# Patient Record
Sex: Female | Born: 1965 | Race: White | Hispanic: No | Smoking: Current every day smoker
Health system: Southern US, Community
[De-identification: ages and names within clinical notes are randomized; demographics above are authoritative.]

## PROBLEM LIST (undated history)

## (undated) DIAGNOSIS — F419 Anxiety disorder, unspecified: Secondary | ICD-10-CM

## (undated) DIAGNOSIS — I10 Essential (primary) hypertension: Secondary | ICD-10-CM

## (undated) DIAGNOSIS — F32A Depression, unspecified: Secondary | ICD-10-CM

## (undated) DIAGNOSIS — G8929 Other chronic pain: Secondary | ICD-10-CM

## (undated) DIAGNOSIS — F329 Major depressive disorder, single episode, unspecified: Secondary | ICD-10-CM

## (undated) DIAGNOSIS — M545 Low back pain, unspecified: Secondary | ICD-10-CM

## (undated) DIAGNOSIS — J189 Pneumonia, unspecified organism: Secondary | ICD-10-CM

## (undated) DIAGNOSIS — K219 Gastro-esophageal reflux disease without esophagitis: Secondary | ICD-10-CM

## (undated) DIAGNOSIS — G43909 Migraine, unspecified, not intractable, without status migrainosus: Secondary | ICD-10-CM

## (undated) DIAGNOSIS — IMO0002 Reserved for concepts with insufficient information to code with codable children: Secondary | ICD-10-CM

## (undated) DIAGNOSIS — E7800 Pure hypercholesterolemia, unspecified: Secondary | ICD-10-CM

## (undated) DIAGNOSIS — Z8719 Personal history of other diseases of the digestive system: Secondary | ICD-10-CM

## (undated) DIAGNOSIS — J439 Emphysema, unspecified: Secondary | ICD-10-CM

## (undated) DIAGNOSIS — E079 Disorder of thyroid, unspecified: Secondary | ICD-10-CM

## (undated) DIAGNOSIS — E119 Type 2 diabetes mellitus without complications: Secondary | ICD-10-CM

## (undated) DIAGNOSIS — C569 Malignant neoplasm of unspecified ovary: Secondary | ICD-10-CM

## (undated) DIAGNOSIS — J449 Chronic obstructive pulmonary disease, unspecified: Secondary | ICD-10-CM

## (undated) HISTORY — PX: BACK SURGERY: SHX140

## (undated) HISTORY — PX: DILATION AND CURETTAGE OF UTERUS: SHX78

## (undated) HISTORY — PX: ABDOMINAL HYSTERECTOMY: SHX81

---

## 2000-07-05 ENCOUNTER — Emergency Department (HOSPITAL_COMMUNITY): Admission: EM | Admit: 2000-07-05 | Discharge: 2000-07-05 | Payer: Self-pay | Admitting: Emergency Medicine

## 2000-08-16 ENCOUNTER — Encounter: Payer: Self-pay | Admitting: Emergency Medicine

## 2000-08-16 ENCOUNTER — Emergency Department (HOSPITAL_COMMUNITY): Admission: EM | Admit: 2000-08-16 | Discharge: 2000-08-16 | Payer: Self-pay | Admitting: Emergency Medicine

## 2000-09-06 ENCOUNTER — Emergency Department (HOSPITAL_COMMUNITY): Admission: EM | Admit: 2000-09-06 | Discharge: 2000-09-06 | Payer: Self-pay | Admitting: Emergency Medicine

## 2001-03-10 ENCOUNTER — Emergency Department (HOSPITAL_COMMUNITY): Admission: EM | Admit: 2001-03-10 | Discharge: 2001-03-11 | Payer: Self-pay | Admitting: Emergency Medicine

## 2001-04-09 ENCOUNTER — Inpatient Hospital Stay (HOSPITAL_COMMUNITY): Admission: RE | Admit: 2001-04-09 | Discharge: 2001-04-12 | Payer: Self-pay | Admitting: *Deleted

## 2001-06-18 ENCOUNTER — Emergency Department (HOSPITAL_COMMUNITY): Admission: EM | Admit: 2001-06-18 | Discharge: 2001-06-18 | Payer: Self-pay | Admitting: Emergency Medicine

## 2001-10-18 ENCOUNTER — Encounter: Payer: Self-pay | Admitting: Family Medicine

## 2001-10-18 ENCOUNTER — Ambulatory Visit (HOSPITAL_COMMUNITY): Admission: RE | Admit: 2001-10-18 | Discharge: 2001-10-18 | Payer: Self-pay | Admitting: Family Medicine

## 2001-12-07 ENCOUNTER — Encounter: Payer: Self-pay | Admitting: Orthopaedic Surgery

## 2001-12-07 ENCOUNTER — Ambulatory Visit (HOSPITAL_COMMUNITY): Admission: RE | Admit: 2001-12-07 | Discharge: 2001-12-07 | Payer: Self-pay | Admitting: Orthopaedic Surgery

## 2001-12-14 ENCOUNTER — Ambulatory Visit (HOSPITAL_COMMUNITY): Admission: RE | Admit: 2001-12-14 | Discharge: 2001-12-14 | Payer: Self-pay | Admitting: Pulmonary Disease

## 2002-01-03 ENCOUNTER — Encounter: Payer: Self-pay | Admitting: Family Medicine

## 2002-01-03 ENCOUNTER — Ambulatory Visit (HOSPITAL_COMMUNITY): Admission: RE | Admit: 2002-01-03 | Discharge: 2002-01-03 | Payer: Self-pay | Admitting: Family Medicine

## 2002-07-08 ENCOUNTER — Emergency Department (HOSPITAL_COMMUNITY): Admission: EM | Admit: 2002-07-08 | Discharge: 2002-07-08 | Payer: Self-pay

## 2002-08-23 ENCOUNTER — Encounter: Payer: Self-pay | Admitting: Emergency Medicine

## 2002-08-23 ENCOUNTER — Emergency Department (HOSPITAL_COMMUNITY): Admission: EM | Admit: 2002-08-23 | Discharge: 2002-08-23 | Payer: Self-pay | Admitting: Emergency Medicine

## 2002-08-27 ENCOUNTER — Emergency Department (HOSPITAL_COMMUNITY): Admission: EM | Admit: 2002-08-27 | Discharge: 2002-08-27 | Payer: Self-pay | Admitting: Emergency Medicine

## 2002-08-27 ENCOUNTER — Encounter: Payer: Self-pay | Admitting: Emergency Medicine

## 2002-10-21 ENCOUNTER — Emergency Department (HOSPITAL_COMMUNITY): Admission: EM | Admit: 2002-10-21 | Discharge: 2002-10-22 | Payer: Self-pay | Admitting: Emergency Medicine

## 2002-10-22 ENCOUNTER — Encounter: Payer: Self-pay | Admitting: Emergency Medicine

## 2002-10-31 ENCOUNTER — Encounter: Payer: Self-pay | Admitting: General Surgery

## 2002-10-31 ENCOUNTER — Ambulatory Visit (HOSPITAL_COMMUNITY): Admission: RE | Admit: 2002-10-31 | Discharge: 2002-10-31 | Payer: Self-pay | Admitting: General Surgery

## 2004-01-03 ENCOUNTER — Ambulatory Visit (HOSPITAL_COMMUNITY): Admission: RE | Admit: 2004-01-03 | Discharge: 2004-01-03 | Payer: Self-pay | Admitting: Family Medicine

## 2004-03-20 ENCOUNTER — Emergency Department (HOSPITAL_COMMUNITY): Admission: EM | Admit: 2004-03-20 | Discharge: 2004-03-20 | Payer: Self-pay | Admitting: Emergency Medicine

## 2004-06-05 ENCOUNTER — Ambulatory Visit (HOSPITAL_COMMUNITY): Admission: RE | Admit: 2004-06-05 | Discharge: 2004-06-05 | Payer: Self-pay | Admitting: Family Medicine

## 2004-06-30 ENCOUNTER — Emergency Department (HOSPITAL_COMMUNITY): Admission: EM | Admit: 2004-06-30 | Discharge: 2004-06-30 | Payer: Self-pay | Admitting: Emergency Medicine

## 2005-04-27 ENCOUNTER — Emergency Department (HOSPITAL_COMMUNITY): Admission: EM | Admit: 2005-04-27 | Discharge: 2005-04-27 | Payer: Self-pay | Admitting: *Deleted

## 2009-10-01 ENCOUNTER — Emergency Department (HOSPITAL_COMMUNITY): Admission: EM | Admit: 2009-10-01 | Discharge: 2009-10-01 | Payer: Self-pay | Admitting: Emergency Medicine

## 2010-01-26 ENCOUNTER — Encounter: Payer: Self-pay | Admitting: Family Medicine

## 2010-01-27 ENCOUNTER — Encounter: Payer: Self-pay | Admitting: Family Medicine

## 2010-01-27 ENCOUNTER — Encounter: Payer: Self-pay | Admitting: *Deleted

## 2010-01-27 ENCOUNTER — Encounter: Payer: Self-pay | Admitting: Neurology

## 2010-04-27 ENCOUNTER — Emergency Department (HOSPITAL_COMMUNITY)
Admission: EM | Admit: 2010-04-27 | Discharge: 2010-04-27 | Disposition: A | Discharge status: Home / Self Care | Payer: Self-pay | Attending: Emergency Medicine | Admitting: Emergency Medicine

## 2010-04-27 ENCOUNTER — Emergency Department (HOSPITAL_COMMUNITY): Payer: Self-pay

## 2010-04-27 DIAGNOSIS — I10 Essential (primary) hypertension: Secondary | ICD-10-CM | POA: Insufficient documentation

## 2010-04-27 DIAGNOSIS — E785 Hyperlipidemia, unspecified: Secondary | ICD-10-CM | POA: Insufficient documentation

## 2010-04-27 DIAGNOSIS — W010XXA Fall on same level from slipping, tripping and stumbling without subsequent striking against object, initial encounter: Secondary | ICD-10-CM | POA: Insufficient documentation

## 2010-04-27 DIAGNOSIS — E119 Type 2 diabetes mellitus without complications: Secondary | ICD-10-CM | POA: Insufficient documentation

## 2010-04-27 DIAGNOSIS — Y9229 Other specified public building as the place of occurrence of the external cause: Secondary | ICD-10-CM | POA: Insufficient documentation

## 2010-04-27 DIAGNOSIS — E039 Hypothyroidism, unspecified: Secondary | ICD-10-CM | POA: Insufficient documentation

## 2010-04-27 DIAGNOSIS — S63509A Unspecified sprain of unspecified wrist, initial encounter: Secondary | ICD-10-CM | POA: Insufficient documentation

## 2010-05-02 ENCOUNTER — Emergency Department (HOSPITAL_COMMUNITY)
Admission: EM | Admit: 2010-05-02 | Discharge: 2010-05-02 | Disposition: A | Discharge status: Home / Self Care | Payer: Self-pay | Attending: Emergency Medicine | Admitting: Emergency Medicine

## 2010-05-02 DIAGNOSIS — M25549 Pain in joints of unspecified hand: Secondary | ICD-10-CM | POA: Insufficient documentation

## 2010-05-02 DIAGNOSIS — X58XXXS Exposure to other specified factors, sequela: Secondary | ICD-10-CM | POA: Insufficient documentation

## 2010-05-02 DIAGNOSIS — IMO0002 Reserved for concepts with insufficient information to code with codable children: Secondary | ICD-10-CM | POA: Insufficient documentation

## 2010-05-24 NOTE — H&P (Signed)
[facility]  Patient:    [name], [name] Visit Number: [id number] MRN: [id number]          Service Type: EMS Location: ED Attending Physician:  Annamarie Dawley Dictated by:   Langley Gauss, M.D. Admit Date:  03/10/2001 Discharge Date: 03/11/2001                           History and Physical  CHIEF COMPLAINT:  This is a 45 year old gravida 5 para 2, with two prior cesarean sections, who is admitted for total abdominal hysterectomy and bilateral salpingo-oophorectomy.  The patients chief complaint is that of menometrorrhagia with flow for two weeks out of the month with clots and severe cramping.  HISTORY OF PRESENT ILLNESS:  She does take Tylenol or Motrin 800 mg tablets with only minimal relief of the dysmenorrhea.  The patient states that the pain increases when she sits down and it feels like she is having a baby.  The bleeding episodes do occur on a q.monthly basis and are associated with passage of clots.  The patient gives the history that she felt as though she had tumors on her ovaries, stating that one to two years ago an ultrasound was ordered by Dr. Emelda Fear which showed at least two cysts; however, in discussion it becomes possible that what she is describing are fibroid tumors. We did attempt to obtain the ultrasound from [facility] one to two years previously and they have no record of an ultrasound having been done.  The patient also states she experiences bilateral ovarian pain constant throughout the month and increasing during her menses.  PAST MEDICAL HISTORY:  1. Low transverse cesarean section x2, in 1988 and 1995.  2. Patient underwent bilateral tubal ligation in 1995.  3. Patient also has recurrent asthmatic bronchitis, for which she takes     Combivent inhaler.     a. Of note, the patient did have an ER visit about one month ago when she        had run out of her Combivent inhaler.  She went to the  emergency        room for treatment of asthma.  Combivent inhaler was restarted at        that time and the patient has continued to do well.  REVIEW OF SYSTEMS:  Pertinent for no history of hypertension, no history of diabetes.  Patient denies any history of stress urinary incontinence or urge incontinence.  She does have occasional hot flashes.  SOCIAL HISTORY:  The patient smokes about a pack a day.  Most recent employment was at [employer].  She is married.  ALLERGIES:  She states she is allergic to SULFA.  CURRENT MEDICATIONS:  Combivent inhaler.  PHYSICAL EXAMINATION:  GENERAL:  Greater than stated age appearing white female.  VITAL SIGNS:  Height 4 feet 7 inches.  Weight 132 pounds.  Blood pressure 136/91, pulse 97, respiratory rate 20.  HEENT:  Negative.  No adenopathy.  Dentition intact.  NECK:  Supple.  Thyroid not palpable.  LUNGS:  Clear.  CARDIOVASCULAR:  Regular rate and rhythm.  ABDOMEN:  Soft, nontender.  Pfannenstiel incision noted from prior low transverse cesarean sections.  EXTREMITIES:  Normal.  PELVIC:  Normal external genitalia.  Cervix without lesions.  Bladder is well supported as well as urethra.  Likewise, there is no significant rectocele identified.  Bimanual examination reveals a diffusely enlarged uterus, particularly in the right  fundal portion.  Adnexa themselves are nonpalpable, but no significant adnexal masses are appreciated.  LABORATORY DATA:  A transvaginal ultrasound was performed by Dr. Roylene Reason. Lisette Grinder, which reveals normal appearing left and right ovaries, though somewhat atrophic in appearance, with the left measuring maximum diameter of 1.04 cm, right maximum diameter of 1.29 cm.  There is noted to be at least two anterior fundal fibroids identified, 2.25 cm and 1.52 cm.  These are very well circumscribed and noted to be calcified.  ASSESSMENT:  Patient with menometrorrhagia and severe dysmenorrhea, bleeding for two  weeks out of the month.  She is not a candidate for oral contraceptives secondary to smoking history.  PLAN:  The patient is admitted at this time for TSH/BSO.  She understands that as a result of surgical menopause she would be well served to start estrogen replacement therapy immediately postoperatively.  The patient expresses understanding of risks and benefits and the procedure was discussed with the patient.  The patient has reviewed the Climara package, understanding hysterectomy.  All questions answered. Dictated by:   Langley Gauss, M.D. Attending Physician:  Annamarie Dawley DD:  04/08/01 TD:  04/09/01 Job: [id number]

## 2010-05-24 NOTE — Procedures (Signed)
   NAME:  LANEE, CHAIN                     ACCOUNT NO.:  [id number]   MEDICAL RECORD NO.:  [id number]                   PATIENT TYPE:  OUT   LOCATION:  RESP                                 FACILITY:  [facility]   PHYSICIAN:  Edward L. Juanetta Gosling, M.D.             DATE OF BIRTH:  May 10, 1965   DATE OF PROCEDURE:  12/14/2001  DATE OF DISCHARGE:                              PULMONARY FUNCTION TEST   1. Spirometry shows severe ventilatory defect.  She could not do lung     volumes or DLCO.  2. There is significant increase in the FVC after inhaled bronchodilator.  3. Arterial blood gas shows normal arterial blood gas.                                                Edward L. Juanetta Gosling, M.D.    ELH/MEDQ  D:  12/14/2001  T:  12/15/2001  Job:  [id number]   cc:   [facility]

## 2010-05-24 NOTE — Discharge Summary (Signed)
[facility]  Patient:    [name], [name] Visit Number: [id number] MRN: [id number]          Service Type: MED Location: 3A A306 01 Attending Physician:  Jeri Cos. Dictated by:   Langley Gauss, M.D. Admit Date:  04/09/2001 Disc. Date: 04/12/01                             Discharge Summary  PROCEDURES:  Total abdominal hysterectomy, bilateral salpingo-oophorectomy.  FOLLOW-UP:  The patient is to follow up in the office in two days time for staple removal.  DISCHARGE MEDICATIONS: 1. Tylox. 2. The patient does have a Climera patch in place at the time of discharge,    but she will be changed to Estrace 1 mg p.o. q.d. for long-term management.  COMPLICATIONS:  Hospitalization is complicated by a postoperative ileus with resultant stay lengthened from April 6 to April 12, 2001.  DISCHARGE INSTRUCTIONS:  At the time of discharge the patient is given the standard discharge instructions.  LABORATORY DATA:  Blood type O positive.  The hCG is negative.  Electrolytes within normal limits.  Hemoglobin and hematocrit 13.5/37.6, with a white count of 9.5.  Postoperative day #1 11.3/32.0.  HOSPITAL COURSE:  The patient was admitted, taken to the operating room for TAH/BSO.  This was performed without complications.  Postoperatively the patient did well.  Foley catheter documented excellent urine output on the evening of surgery.  The Foley catheter was removed on postoperative day #1. The patient thereafter was up to ambulate to smoke only.  The JP drain was removed on postoperative day #2 when drainage had markedly subsided.  However, the patient still had not passed any flatus despite ambulation.  The patient continued to be afebrile, had excellent urine output.  She did advance her diet.  She did receive a single Dulcolax suppository on April 11, 2001.  On April 12, 2001, the patient is now ambulating much better.  She complains of no hot  flashes.  She is tolerating a regular general diet.  Tylox p.o. is adequate for pain relief, and she has had normal resumption of bowel function. Thus, the patient is discharged home on April 12, 2001.  Follow up in the office in two days time for staple removal from the Pfannenstiel incision. Dictated by:   Langley Gauss, M.D. Attending Physician:  Jeri Cos. DD:  04/12/01 TD:  04/12/01 Job: [id number]

## 2010-05-24 NOTE — Op Note (Signed)
[facility]  Patient:    [name], [name] Visit Number: [id number] MRN: [id number]          Service Type: MED Location: 3A A306 01 Attending Physician:  Jeri Cos. Dictated by:   Langley Gauss, M.D. Proc. Date: 04/09/01 Admit Date:  04/09/2001                             Operative Report  PREOPERATIVE DIAGNOSES: 1. Menometrorrhagia. 2. Dysmenorrhea.  PROCEDURE PERFORMED:  Total abdominal hysterectomy, bilateral salpingo-oophorectomy.  SURGEON:  Langley Gauss, M.D.  ESTIMATED BLOOD LOSS:  200 cc.  ANESTHESIA:  General anesthesia.  DRAINS:  Foley catheter to straight drainage, JP catheter within the subcutaneous space.  OPERATIVE REPORT:  The patient was taken to the operating room.  Vital signs were stable.  The patient underwent an uncomplicated induction of general endotracheal anesthesia after which time she was prepped and draped in the usual sterile fashion.  A Foley catheter was sterilely placed to straight drainage with findings of clear yellow urine.  A sharp knife was then used to incise a Pfannenstiel incision through the skin at her previous operative site and dissected down through the fascial plane utilizing a sharp knife, cauterizing all bleeders along the way.  The fascia was then incised in a transverse curvilinear manner, utilizing the Mayo scissors while sharply dissecting off the underlying rectus muscles.  The edges of the fascia were then grasped using straight Kocher clamps with the fascia dissecting off the underlying rectus muscles in the midline, utilizing the Mayo scissors.  This was done both superiorly and inferiorly at the incision site.  The rectus muscles were then bluntly separated.  The peritoneal cavity was atraumatically bluntly entered at the superior most portion of this incision.  The peritoneal incision next done superiorly and inferiorly where we directly visualized the bladder to avoid  its accidental entry.  Self-retraining retractor was placed as well as a bladder blade.  Due to the obesity and redundant perineal tissue with underlying fat, it was required to elevate and suture the bladder peritoneum to the anterior abdominal skin.  After placing the bladder blade and the self-retaining retractors, moist packs were then used to mobilize bladder out of the operative field.  Findings at the time of surgery included diffusely enlarged uterus, atrophic-appearing ovaries bilaterally, no significant adhesive disease encountered with the exception of some omentum adherent to the anterior abdominal wall, which was easily dissected free utilizing the Bovie on a cauterization setting.  Long straight Kocher clamps were then used to grasp the specimen at the junction of the round ligament and fallopian tubes at the uterus itself.  This allowed manipulation of the specimen throughout the operative procedure.  The right round ligament was identified, clamped with a Kelly clamp to avoid any back bleeding, suture ligature of #0 Vicryl in a Heaney fashion was then placed.  Mayo scissors were then used to sharply transect the round ligament.  This allowed skeletonization of the infundibulopelvic ligament on the right by dissecting the vascular plane of the broad ligament.  The ureters were noted to be in the normal anatomic position along the lateral pelvic sidewalls.  The right infundibulopelvic ligament was then doubly clamped with Kelly clamps and then doubly ligated first with a free tie of #0 Vicryl followed by suture ligature of #0 Vicryl in a Heaney fashion.  I then performed the identical procedure on the left by ligating the  left round ligament with #0 Vicryl in a Heaney fashion, followed by skeletonization of the left infundibulopelvic ligament, and then doubly ligated this small pedicle with #0 Vicryl free tie, followed by #0 Vicryl suture in a Heaney fashion.  These pedicles  being secured, I then was able to skeletonize the uterine vesicles bilaterally, and continuing across the anterior lower uterine segment in the avascular plane to create a bladder flap.  The right uterine vesicle was clamped first.  A Kelly clamp was placed to control any back bleeding, followed by curved Heaney clamp, followed by suture ligature of #0 Vicryl.  The left uterine vesicle was likewise clamped with a single curved Heaney clamp followed by suture ligature of #0 Vicryl.  This took me down to the level of the cardinal ligaments.  Each of these were clamped with a straight Heaney clamp, followed by suture ligature of #0 Vicryl in Heaney fashion.  The uterosacral ligaments bilaterally were then clamped sequentially with curved Heaney clamps followed by suture ligature of #0 Vicryl in the Heaney fashion.  The vaginal angles then identified.  The right vaginal angle was clamped and dissected at the vaginal angle which allowed entry into the upper vagina.  Suture ligature of #0 Vicryl in a Heaney fashion was then placed at the right vaginal angle.  This was tagged for later inspection.  Likewise the left vaginal angle was identified, clamped with a curved Heaney clamp, followed by suture ligature of #0 Vicryl in a Heaney fashion, and tagged for later inspection.  This then allowed me to transect across the vaginal mucosa leaving a maximum vaginal depth while removing in entirety the specimen which was then handed off for permanent specimen.  Irrigation was then performed to the vaginal cuff utilizing a Betadine solution, and the vaginal cuff, after grasping with straight Kocher clamps, was closed with figure-of-eight sutures of #0 Vicryl, three in total number required.  Irrigation was then performed at the pelvic cavity to assure hemostasis.  At the level of the right vaginal cuff a small amount of tissue had slipped through our clamp prior to suturing.  Thus, an  additional figure-of-eight suture was placed in the right vaginal angle to reinforce the suture here, and assure hemostasis.  Copious irrigation was then performed to  assure hemostasis.  Hemostasis being assured our instruments were removed as well as our moist packs.  Sponge and instrument counts were correct x 2 at this point.  Thus, procedure closure was initiated.  The peritoneal edges were grasped using Kelly clamps.  The peritoneum was closed with a continuous running #0 chromic suture.  Rectus muscles were closed with a continuous running #0 chromic suture reapproximating them in the midline.  A looped #1 Novofil suture was then used to completely close the fascia.  A JP drain was then placed in the subcutaneous space with a separate exit wound to the right apex of the incision.  Subcutaneous bleeders were cauterized.  Three interrupted #1 Maxon sutures were placed through-and-through the skin edges to facilitate complete closure.  The skin was then completely closed utilizing skin staples.  The patient tolerated the procedure well.  In an effort to facilitate postoperative analgesia, 20 cc of 0.5% bupivacaine was injected subcutaneously and along the entirety of the incision.  The patient continued to drain clear yellow urine.  She was reversed of anesthesia, taken to the recovery room in stable condition, at which time operative findings were discussed with the patients awaiting family. Dictated by:  Langley Gauss, M.D. Attending Physician:  Jeri Cos. DD:  04/12/01 TD:  04/13/01 Job: [id number]

## 2010-07-16 ENCOUNTER — Emergency Department (HOSPITAL_COMMUNITY)
Admission: EM | Admit: 2010-07-16 | Discharge: 2010-07-16 | Disposition: A | Discharge status: Home / Self Care | Payer: Self-pay | Attending: Emergency Medicine | Admitting: Emergency Medicine

## 2010-07-16 DIAGNOSIS — E119 Type 2 diabetes mellitus without complications: Secondary | ICD-10-CM | POA: Insufficient documentation

## 2010-07-16 DIAGNOSIS — J4489 Other specified chronic obstructive pulmonary disease: Secondary | ICD-10-CM | POA: Insufficient documentation

## 2010-07-16 DIAGNOSIS — I10 Essential (primary) hypertension: Secondary | ICD-10-CM | POA: Insufficient documentation

## 2010-07-16 DIAGNOSIS — S335XXA Sprain of ligaments of lumbar spine, initial encounter: Secondary | ICD-10-CM | POA: Insufficient documentation

## 2010-07-16 DIAGNOSIS — F17200 Nicotine dependence, unspecified, uncomplicated: Secondary | ICD-10-CM | POA: Insufficient documentation

## 2010-07-16 DIAGNOSIS — J449 Chronic obstructive pulmonary disease, unspecified: Secondary | ICD-10-CM | POA: Insufficient documentation

## 2010-07-16 HISTORY — DX: Chronic obstructive pulmonary disease, unspecified: J44.9

## 2010-07-16 HISTORY — DX: Reserved for concepts with insufficient information to code with codable children: IMO0002

## 2010-07-16 HISTORY — DX: Depression, unspecified: F32.A

## 2010-07-16 HISTORY — DX: Essential (primary) hypertension: I10

## 2010-07-16 HISTORY — DX: Major depressive disorder, single episode, unspecified: F32.9

## 2010-07-16 HISTORY — DX: Disorder of thyroid, unspecified: E07.9

## 2010-07-16 MED ORDER — METHOCARBAMOL 500 MG PO TABS: 500.0000 mg | ORAL_TABLET | Freq: Three times a day (TID) | ORAL | Status: AC

## 2010-07-16 MED ORDER — HYDROCODONE-ACETAMINOPHEN 5-325 MG PO TABS: ORAL_TABLET | ORAL | Status: AC

## 2010-07-16 MED ORDER — PREDNISONE 10 MG PO TABS: ORAL_TABLET | ORAL | Status: DC
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
## 2010-07-16 NOTE — ED Notes (Signed)
Pt states that she is allergic to prednisone, [place], [place] notified, pt also states that she does not have any pain medication at home, [place], [place] notified, additional prescriptions,
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
## 2010-08-28 ENCOUNTER — Emergency Department (HOSPITAL_COMMUNITY)
Admission: EM | Admit: 2010-08-28 | Discharge: 2010-08-28 | Disposition: A | Discharge status: Home / Self Care | Payer: Self-pay | Attending: Emergency Medicine | Admitting: Emergency Medicine

## 2010-08-28 ENCOUNTER — Encounter (HOSPITAL_COMMUNITY): Payer: Self-pay | Admitting: *Deleted

## 2010-08-28 ENCOUNTER — Emergency Department (HOSPITAL_COMMUNITY): Payer: Self-pay

## 2010-08-28 DIAGNOSIS — M542 Cervicalgia: Secondary | ICD-10-CM | POA: Insufficient documentation

## 2010-08-28 DIAGNOSIS — M549 Dorsalgia, unspecified: Secondary | ICD-10-CM | POA: Insufficient documentation

## 2010-08-28 DIAGNOSIS — W010XXA Fall on same level from slipping, tripping and stumbling without subsequent striking against object, initial encounter: Secondary | ICD-10-CM | POA: Insufficient documentation

## 2010-08-28 DIAGNOSIS — M25559 Pain in unspecified hip: Secondary | ICD-10-CM | POA: Insufficient documentation

## 2010-08-28 DIAGNOSIS — M25529 Pain in unspecified elbow: Secondary | ICD-10-CM | POA: Insufficient documentation

## 2010-08-28 DIAGNOSIS — Y92009 Unspecified place in unspecified non-institutional (private) residence as the place of occurrence of the external cause: Secondary | ICD-10-CM | POA: Insufficient documentation

## 2010-08-28 DIAGNOSIS — M25569 Pain in unspecified knee: Secondary | ICD-10-CM | POA: Insufficient documentation

## 2010-08-28 MED ORDER — HYDROCODONE-ACETAMINOPHEN 5-325 MG PO TABS: 1.0000 | ORAL_TABLET | Freq: Four times a day (QID) | ORAL | Status: AC | PRN

## 2010-08-28 NOTE — ED Provider Notes (Signed)
History   Chart scribed for Tanya Lennert, MD by Enos Fling; the patient was seen in room APA11/APA11; this patient's care was started at 12:04 PM.    CSN: [id number] Arrival date & time: 08/28/2010 11:20 AM  Chief Complaint  Patient presents with  . Fall   HPI TANIJA GERMANI is a 45 y.o. female who presents to the Emergency Department s/p fall. Pt reports fall 2 days ago by tripping over step walking into her house. No head injury or LOC. Pt c/o pain to neck, low back, right hip, left knee, and left elbow constant since fall. Pt has been using tylenol for pain with no relief. Pt states she has chronic back pain and this pain in her back is the same pain as usual but worse since fall. Used to have hydrocodone for chronic pain but ran out yesterday; states she took it every 4-6 hours and it would only ease the pain. No abd pain, chest pain, n/v/d,    Review of Systems  Constitutional: Negative for fatigue.  HENT: Negative for congestion, sinus pressure and ear discharge.   Eyes: Negative for discharge.  Respiratory: Negative for cough.   Cardiovascular: Negative for chest pain.  Gastrointestinal: Negative for abdominal pain and diarrhea.  Genitourinary: Negative for frequency and hematuria.  Musculoskeletal: Positive for back pain and arthralgias.  Skin: Negative for rash.  Neurological: Negative for seizures and headaches.  Hematological: Negative.   Psychiatric/Behavioral: Negative for hallucinations.    Physical Exam  BP 122/72  Pulse 74  Temp(Src) 98.4 F (36.9 C) (Oral)  Resp 14  Ht 4' (1.219 m)  Wt 145 lb (65.772 kg)  BMI 44.25 kg/m2  SpO2 97%  Physical Exam  Constitutional: She is oriented to person, place, and time. She appears well-developed.       Appears slightly drowsy  HENT:  Head: Normocephalic and atraumatic.  Eyes: Conjunctivae and EOM are normal. No scleral icterus.  Neck: Neck supple. No thyromegaly present.  Cardiovascular: Normal rate and  regular rhythm.  Exam reveals no gallop and no friction rub.   No murmur heard. Pulmonary/Chest: No stridor. She has no wheezes. She has no rales. She exhibits no tenderness.  Abdominal: She exhibits no distension. There is no tenderness. There is no rebound.  Musculoskeletal: Normal range of motion. She exhibits tenderness. She exhibits no edema.       Diffuse tenderness to cervical spine, lumbar spine, left hip, and right hip - no point tenderness, swelling, or bruising; tenderness and bruising to left elbow and left knee; FROM to all extremities  Lymphadenopathy:    She has no cervical adenopathy.  Neurological: She is oriented to person, place, and time. Coordination normal.  Skin: No rash noted. No erythema.  Psychiatric: She has a normal mood and affect. Her behavior is normal.    ED Course  Procedures  OTHER DATA REVIEWED: Nursing notes and vital signs reviewed. Prior records reviewed.   LABS / RADIOLOGY: Dg Lumbar Spine Complete  08/28/2010  *RADIOLOGY REPORT*  Clinical Data: Low back pain post fall  LUMBAR SPINE - COMPLETE 4+ VIEW  Comparison: 10/01/2009  Findings: Five non-rib bearing lumbar vertebrae. Question mild osseous demineralization. Vertebral body and disc space heights maintained. No acute fracture, subluxation or bone destruction. No spondylolysis. SI joints symmetric. Scattered phleboliths.  IMPRESSION: No acute lumbar spine abnormalities.  Original Report Authenticated By: Lollie Marrow, M.D.   Dg Pelvis 1-2 Views  08/28/2010  *RADIOLOGY REPORT*  Clinical Data: Pelvic pain  post fall  PELVIS - 1-2 VIEW  Comparison: None  Findings: Osseous demineralization. Hip and SI joints preserved. No acute fracture, dislocation, or bone destruction. Symmetric sacral neural foramina.  IMPRESSION: No acute abnormalities.  Original Report Authenticated By: Lollie Marrow, M.D.   Dg Elbow Complete Left  08/28/2010  *RADIOLOGY REPORT*  Clinical Data:  Medial left elbow pain post fall   LEFT ELBOW - COMPLETE 3+ VIEW  Comparison: None  Findings: Bone mineralization normal. Joint spaces preserved. No fracture, dislocation, or bone destruction. No joint effusion.  IMPRESSION: Normal exam.  Original Report Authenticated By: Lollie Marrow, M.D.   Dg Knee Complete 4 Views Left  08/28/2010  *RADIOLOGY REPORT*  Clinical Data: Anterior left knee pain post fall  LEFT KNEE - COMPLETE 4+ VIEW  Comparison: 04/27/2005  Findings: Osseous demineralization questioned. Minimal medial compartment joint space narrowing. No acute fracture, dislocation or bone destruction. No knee joint effusion.  IMPRESSION: Minimal degenerative changes right knee. No acute abnormalities.  Original Report Authenticated By: Lollie Marrow, M.D.    ED COURSE: 2:59 PM - Pt sleeping, no pain currently. All results reviewed and discussed with pt, questions answered, pt agreeable with plan.  MDM:   IMPRESSION: No diagnosis found.   PLAN: discharge All results reviewed and discussed with pt, questions answered, pt agreeable with plan.   CONDITION ON DISCHARGE: stable   MEDS GIVEN IN ED: Medications - No data to display   DISCHARGE MEDICATIONS: New Prescriptions   No medications on file     SCRIBE ATTESTATION: The chart was scribed for me under my direct supervision.  I personally performed the history, physical, and medical decision making and all procedures in the evaluation of this patient.Tanya Lennert, MD 08/28/10 1500

## 2010-08-28 NOTE — ED Notes (Signed)
C/o multiple falls over last few weeks.  Last fall x 2 days ago.  C/o left knee pain, right hip pain, and lower back pain.  Denies LOC during any of recent falls.

## 2010-09-17 ENCOUNTER — Emergency Department (HOSPITAL_COMMUNITY): Payer: Self-pay

## 2010-09-17 ENCOUNTER — Emergency Department (HOSPITAL_COMMUNITY)
Admission: EM | Admit: 2010-09-17 | Discharge: 2010-09-17 | Disposition: A | Discharge status: Home / Self Care | Payer: Self-pay | Attending: Emergency Medicine | Admitting: Emergency Medicine

## 2010-09-17 ENCOUNTER — Encounter (HOSPITAL_COMMUNITY): Payer: Self-pay | Admitting: Emergency Medicine

## 2010-09-17 DIAGNOSIS — J4489 Other specified chronic obstructive pulmonary disease: Secondary | ICD-10-CM | POA: Insufficient documentation

## 2010-09-17 DIAGNOSIS — M545 Low back pain, unspecified: Secondary | ICD-10-CM | POA: Insufficient documentation

## 2010-09-17 DIAGNOSIS — I10 Essential (primary) hypertension: Secondary | ICD-10-CM | POA: Insufficient documentation

## 2010-09-17 DIAGNOSIS — S335XXA Sprain of ligaments of lumbar spine, initial encounter: Secondary | ICD-10-CM

## 2010-09-17 DIAGNOSIS — R109 Unspecified abdominal pain: Secondary | ICD-10-CM | POA: Insufficient documentation

## 2010-09-17 DIAGNOSIS — R300 Dysuria: Secondary | ICD-10-CM | POA: Insufficient documentation

## 2010-09-17 DIAGNOSIS — J449 Chronic obstructive pulmonary disease, unspecified: Secondary | ICD-10-CM | POA: Insufficient documentation

## 2010-09-17 DIAGNOSIS — N390 Urinary tract infection, site not specified: Secondary | ICD-10-CM | POA: Insufficient documentation

## 2010-09-17 LAB — URINALYSIS, ROUTINE W REFLEX MICROSCOPIC
Bilirubin Urine: NEGATIVE
Glucose, UA: NEGATIVE mg/dL
Hgb urine dipstick: NEGATIVE
Ketones, ur: NEGATIVE mg/dL
Nitrite: NEGATIVE
Protein, ur: NEGATIVE mg/dL
Specific Gravity, Urine: 1.02 (ref 1.005–1.030)
pH: 5.5 (ref 5.0–8.0)

## 2010-09-17 LAB — URINE MICROSCOPIC-ADD ON

## 2010-09-17 MED ORDER — HYDROCODONE-ACETAMINOPHEN 5-325 MG PO TABS
2.0000 | ORAL_TABLET | Freq: Once | ORAL | Status: AC
  Administered 2010-09-17: 2 via ORAL
  Filled 2010-09-17: qty 2

## 2010-09-17 MED ORDER — NITROFURANTOIN MONOHYD MACRO 100 MG PO CAPS: 100.0000 mg | ORAL_CAPSULE | Freq: Two times a day (BID) | ORAL | Status: AC

## 2010-09-17 MED ORDER — HYDROCODONE-ACETAMINOPHEN 5-325 MG PO TABS: 1.0000 | ORAL_TABLET | ORAL | Status: AC | PRN

## 2010-09-17 NOTE — ED Notes (Signed)
Pt c/o dysuria/hematuria/lower back pain.

## 2010-09-17 NOTE — ED Provider Notes (Signed)
History     CSN: [id number] Arrival date & time: 09/17/2010  2:25 PM  Chief Complaint  Patient presents with  . Dysuria  . Back Pain   HPI [name] is a 45 y.o. female who presents to the ED with UTI symptoms that started yesterday. She also complains of low back pain after a fall a few days ago. She has a history of chronic back pain and DDD, but pain has been worse since the fall.  Past Medical History  Diagnosis Date  . DDD (degenerative disc disease)   . COPD (chronic obstructive pulmonary disease)   . Diabetes mellitus   . Hypertension   . Thyroid disease   . Depression     Past Surgical History  Procedure Date  . Abdominal hysterectomy   . Cesarean section     History reviewed. No pertinent family history.  History  Substance Use Topics  . Smoking status: Passive Smoker  . Smokeless tobacco: Not on file  . Alcohol Use: No    OB History    Grav Para Term Preterm Abortions TAB SAB Ect Mult Living                  Review of Systems  Constitutional: Positive for unexpected weight change. Negative for fever and activity change.  HENT: Negative.   Eyes: Negative.   Respiratory: Negative for cough and wheezing.   Cardiovascular: Negative for chest pain, palpitations and leg swelling.  Gastrointestinal: Positive for nausea and abdominal pain. Negative for vomiting.  Genitourinary: Positive for dysuria, urgency and frequency.  Musculoskeletal: Positive for back pain.  Skin: Negative for rash and wound.  Neurological: Negative for dizziness, light-headedness and headaches.    Physical Exam  BP 156/93  Pulse 72  Temp(Src) 99.5 F (37.5 C) (Oral)  Resp 18  SpO2 99%  Physical Exam  Nursing note and vitals reviewed. Constitutional: She is oriented to person, place, and time. She appears well-developed and well-nourished. No distress.  HENT:  Head: Normocephalic and atraumatic.  Eyes: EOM are normal.  Neck: Normal range of motion. Neck supple.    Cardiovascular: Normal rate and regular rhythm.   Pulmonary/Chest: Effort normal.  Abdominal: Soft. There is tenderness in the suprapubic area.  Musculoskeletal: Normal range of motion.  Neurological: She is alert and oriented to person, place, and time. No cranial nerve deficit.  Skin: Skin is warm and dry.    ED Course  Procedures Patient awaiting x-ray. Care turned over to Burgess Amor, [facility]       [place], [place] 09/17/10 1710

## 2010-09-17 NOTE — ED Notes (Signed)
Pt a/ox4. Resp even and unlabored. NAD at this time. D/C instructions reviewed with pt. Pt verbalized understanding. Pt ambulated to lobby to wait for ride. Pt ambulated with steady gate.

## 2010-09-17 NOTE — ED Notes (Signed)
Patients LS spine is negative for any acute injury.  Will tx pain and also prescribe abx for uti.  Candis Musa, PA 09/17/10 1735  Donnetta Hutching, MD 01/24/14 2201

## 2010-09-17 NOTE — ED Provider Notes (Signed)
Medical screening examination/treatment/procedure(s) were performed by non-physician practitioner and as supervising physician I was immediately available for consultation/collaboration.  Charline Hoskinson, MD 09/17/10 1906 

## 2010-10-14 ENCOUNTER — Encounter (HOSPITAL_COMMUNITY): Payer: Self-pay | Admitting: Emergency Medicine

## 2010-10-14 ENCOUNTER — Emergency Department (HOSPITAL_COMMUNITY): Payer: Self-pay

## 2010-10-14 ENCOUNTER — Emergency Department (HOSPITAL_COMMUNITY)
Admission: EM | Admit: 2010-10-14 | Discharge: 2010-10-14 | Disposition: A | Discharge status: Home / Self Care | Payer: Self-pay | Attending: Emergency Medicine | Admitting: Emergency Medicine

## 2010-10-14 DIAGNOSIS — S0083XA Contusion of other part of head, initial encounter: Secondary | ICD-10-CM

## 2010-10-14 DIAGNOSIS — M1990 Unspecified osteoarthritis, unspecified site: Secondary | ICD-10-CM | POA: Insufficient documentation

## 2010-10-14 DIAGNOSIS — F17200 Nicotine dependence, unspecified, uncomplicated: Secondary | ICD-10-CM | POA: Insufficient documentation

## 2010-10-14 DIAGNOSIS — Z79899 Other long term (current) drug therapy: Secondary | ICD-10-CM | POA: Insufficient documentation

## 2010-10-14 DIAGNOSIS — M549 Dorsalgia, unspecified: Secondary | ICD-10-CM

## 2010-10-14 DIAGNOSIS — W19XXXA Unspecified fall, initial encounter: Secondary | ICD-10-CM | POA: Insufficient documentation

## 2010-10-14 DIAGNOSIS — S0003XA Contusion of scalp, initial encounter: Secondary | ICD-10-CM | POA: Insufficient documentation

## 2010-10-14 DIAGNOSIS — M545 Low back pain, unspecified: Secondary | ICD-10-CM | POA: Insufficient documentation

## 2010-10-14 MED ORDER — DIPHENHYDRAMINE HCL 25 MG PO CAPS
25.0000 mg | ORAL_CAPSULE | Freq: Once | ORAL | Status: AC
  Administered 2010-10-14: 25 mg via ORAL
  Filled 2010-10-14: qty 1

## 2010-10-14 MED ORDER — CARISOPRODOL 350 MG PO TABS: 350.0000 mg | ORAL_TABLET | Freq: Three times a day (TID) | ORAL | Status: AC

## 2010-10-14 MED ORDER — DIAZEPAM 5 MG PO TABS
5.0000 mg | ORAL_TABLET | Freq: Once | ORAL | Status: AC
  Administered 2010-10-14: 5 mg via ORAL
  Filled 2010-10-14: qty 1

## 2010-10-14 MED ORDER — HYDROCODONE-ACETAMINOPHEN 5-325 MG PO TABS
2.0000 | ORAL_TABLET | Freq: Once | ORAL | Status: AC
  Administered 2010-10-14: 2 via ORAL
  Filled 2010-10-14: qty 2

## 2010-10-14 MED ORDER — HYDROCODONE-ACETAMINOPHEN 5-325 MG PO TABS: 1.0000 | ORAL_TABLET | ORAL | Status: AC | PRN

## 2010-10-14 NOTE — ED Provider Notes (Signed)
History     CSN: [id number] Arrival date & time: 10/14/2010  8:38 PM  Chief Complaint  Patient presents with  . Fall  . Back Pain    (Consider location/radiation/quality/duration/timing/severity/associated sxs/prior treatment) HPI Comments: Pt states she has degenerative problems of the spine and had frequent falls. On yesterday she had more than one fall. She c/o bruises to the forehead, upper chest both arms and the left knee. She c/o being dizzy from time to time. She mostly c/o low back pain.  Patient is a 45 y.o. female presenting with fall and back pain. The history is provided by the patient.  Fall The accident occurred yesterday. The fall occurred while walking. She landed on carpet. There was no blood loss. The point of impact was the head (lower back). The pain is present in the head and left knee. The pain is severe. She was ambulatory at the scene. Pertinent negatives include no fever, no abdominal pain, no bowel incontinence, no nausea, no vomiting, no hematuria and no loss of consciousness. The symptoms are aggravated by standing. Treatments tried: Her own medications. The treatment provided no relief.  Back Pain  Pertinent negatives include no chest pain, no fever, no abdominal pain, no bowel incontinence and no dysuria.    Past Medical History  Diagnosis Date  . DDD (degenerative disc disease)   . COPD (chronic obstructive pulmonary disease)   . Diabetes mellitus   . Hypertension   . Thyroid disease   . Depression     Past Surgical History  Procedure Date  . Abdominal hysterectomy   . Cesarean section     No family history on file.  History  Substance Use Topics  . Smoking status: Current Everyday Smoker -- 0.5 packs/day    Types: Cigarettes  . Smokeless tobacco: Not on file  . Alcohol Use: No    OB History    Grav Para Term Preterm Abortions TAB SAB Ect Mult Living                  Review of Systems  Constitutional: Negative for fever and  activity change.       All ROS Neg except as noted in HPI  HENT: Negative for nosebleeds and neck pain.   Eyes: Negative for photophobia and discharge.  Respiratory: Negative for cough, shortness of breath and wheezing.   Cardiovascular: Negative for chest pain and palpitations.  Gastrointestinal: Negative for nausea, vomiting, abdominal pain, blood in stool and bowel incontinence.  Genitourinary: Negative for dysuria, frequency and hematuria.  Musculoskeletal: Positive for back pain. Negative for arthralgias.  Skin: Negative.        abrasions  Neurological: Negative for dizziness, seizures, loss of consciousness and speech difficulty.  Psychiatric/Behavioral: Negative for hallucinations and confusion.    Allergies  Flexeril; Keflex; Morphine and related; Ultram; Nsaids; Sulfa antibiotics; and Codeine  Home Medications   Current Outpatient Rx  Name Route Sig Dispense Refill  . ACETAMINOPHEN 500 MG PO TABS Oral Take 2,000 mg by mouth once as needed. For fever and for pain. Patient states that she sometimes takes 6 tablets in one dose for pain.     . ATENOLOL 100 MG PO TABS Oral Take 100 mg by mouth daily.      . CHOLECALCIFEROL 2000 UNITS PO CAPS Oral Take 1 capsule by mouth daily.      Marland Kitchen CLONAZEPAM 1 MG PO TABS Oral Take 1 mg by mouth 3 (three) times daily as needed. For anxiety and panic  attacks     . COD LIVER OIL PO CAPS Oral Take 1 capsule by mouth daily.      Marland Kitchen ESCITALOPRAM OXALATE 20 MG PO TABS Oral Take 20 mg by mouth every morning.     Marland Kitchen OMEGA-3 FATTY ACIDS 1000 MG PO CAPS Oral Take 1 g by mouth daily.     . IPRATROPIUM BROMIDE HFA 17 MCG/ACT IN AERS Inhalation Inhale 2 puffs into the lungs every 6 (six) hours.      . IPRATROPIUM BROMIDE 0.02 % IN SOLN Nebulization Take 500 mcg by nebulization 4 (four) times daily.      Marland Kitchen LEVOTHYROXINE SODIUM 25 MCG PO TABS Oral Take 25 mcg by mouth daily.      Marland Kitchen METFORMIN HCL 1000 MG PO TABS Oral Take 1,000 mg by mouth 2 (two) times daily  with a meal.      . PRAVASTATIN SODIUM 40 MG PO TABS Oral Take 40 mg by mouth at bedtime.     Marland Kitchen VITAMIN C 500 MG PO TABS Oral Take 1,500 mg by mouth daily.     Marland Kitchen CARISOPRODOL 350 MG PO TABS Oral Take 1 tablet (350 mg total) by mouth 3 (three) times daily. 15 tablet 0  . HYDROCODONE-ACETAMINOPHEN 7.5-500 MG PO TABS Oral Take 1 tablet by mouth every 6 (six) hours as needed.      Marland Kitchen HYDROCODONE-ACETAMINOPHEN 5-325 MG PO TABS Oral Take 1 tablet by mouth every 4 (four) hours as needed for pain. 10 tablet 0  . METFORMIN HCL ER 500 MG PO TB24 Oral Take 500 mg by mouth 2 (two) times daily.        BP 135/75  Pulse 70  Temp(Src) 97.8 F (36.6 C) (Oral)  Resp 20  Ht 4' (1.219 m)  Wt 132 lb (59.875 kg)  BMI 40.28 kg/m2  SpO2 97%  Physical Exam  Nursing note and vitals reviewed. Constitutional: She is oriented to person, place, and time. She appears well-developed and well-nourished.  Non-toxic appearance.  HENT:  Head: Normocephalic.  Right Ear: Tympanic membrane and external ear normal.  Left Ear: Tympanic membrane and external ear normal.       2 small abrasion of the mid forehead.  Eyes: EOM and lids are normal. Pupils are equal, round, and reactive to light.  Neck: Normal range of motion. Neck supple. Carotid bruit is not present.  Cardiovascular: Normal rate, regular rhythm, normal heart sounds, intact distal pulses and normal pulses.   Pulmonary/Chest: Breath sounds normal. No respiratory distress.       Small bruise to the right upper anterior chest  Abdominal: Soft. Bowel sounds are normal. There is no tenderness. There is no rebound and no guarding.  Musculoskeletal: Normal range of motion.       Bruise on the right and left arm. Pain to palpationand attempted ROM of the lower back. No hot areas.  Lymphadenopathy:       Head (right side): No submandibular adenopathy present.       Head (left side): No submandibular adenopathy present.    She has no cervical adenopathy.    Neurological: She is alert and oriented to person, place, and time. She has normal strength. No cranial nerve deficit or sensory deficit.  Skin: Skin is warm and dry.  Psychiatric: Her speech is normal. Her mood appears anxious.    ED Course: Before going to xray, pt was found on the floor next to the bed. She c/o increased low back pain. States she accidentally  fell turning over. After pain meds and upon return from xray pain improving. No change in neuro exam.  Procedures (including critical care time)  Labs Reviewed - No data to display Dg Lumbar Spine Complete  10/14/2010  *RADIOLOGY REPORT*  Clinical Data: Fall.  Pain.  LUMBAR SPINE - COMPLETE 4+ VIEW  Comparison: 09/17/2010.  08/28/2010.  10/01/2009.  Findings: Five lumbar-type vertebral bodies show a slightly exaggerated lordosis.  No disc space narrowing.  There is facet degeneration at L5-S1 bilaterally and there is bilateral sacroiliac osteoarthritis.  IMPRESSION: No acute finding.  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  Original Report Authenticated By: Thomasenia Sales, M.D.   Ct Head Wo Contrast  10/14/2010  *RADIOLOGY REPORT*  Clinical Data: Fall.  Head trauma.  Dizziness.  CT HEAD WITHOUT CONTRAST  Technique:  Contiguous axial images were obtained from the base of the skull through the vertex without contrast.  Comparison: None  Findings: The brain has a normal appearance without evidence of atrophy, old or acute infarction, mass lesion, hemorrhage, hydrocephalus or extra-axial collection.  No skull fracture.  No traumatic fluid in the sinuses.  There are some inflammatory changes of the left maxillary sinus.  IMPRESSION: Normal appearance of the brain.  Some inflammation of the left maxillary sinus.  Original Report Authenticated By: Thomasenia Sales, M.D.     1. Osteoarthritis   2. Contusion of forehead   3. Back pain       MDM  I have reviewed nursing notes, vital signs, and all  appropriate lab and imaging results for this patient.        Kathie Dike, [place] 10/14/10 [phone]

## 2010-10-14 NOTE — ED Provider Notes (Signed)
Medical screening examination/treatment/procedure(s) were performed by non-physician practitioner and as supervising physician I was immediately available for consultation/collaboration.   Lashala Laser L Quentin Strebel, MD 10/14/10 2339 

## 2010-10-14 NOTE — ED Notes (Signed)
Patient states she fell about 4 times yesterday; c/o back pain.  States was having dizzy spells yesterday.

## 2010-10-14 NOTE — ED Notes (Signed)
Pt observed lying in floor. Pt states she was going to bathroom and couldn't make it. No injuries noted. Assisted pt back to bed. Counselled pt on getting out of bed without assistance. Pt placed on bedpan per female tech.

## 2010-11-17 ENCOUNTER — Encounter (HOSPITAL_COMMUNITY): Payer: Self-pay

## 2010-11-17 ENCOUNTER — Emergency Department (HOSPITAL_COMMUNITY)
Admission: EM | Admit: 2010-11-17 | Discharge: 2010-11-17 | Disposition: A | Discharge status: Home / Self Care | Payer: Self-pay | Attending: Emergency Medicine | Admitting: Emergency Medicine

## 2010-11-17 DIAGNOSIS — K0889 Other specified disorders of teeth and supporting structures: Secondary | ICD-10-CM

## 2010-11-17 DIAGNOSIS — F329 Major depressive disorder, single episode, unspecified: Secondary | ICD-10-CM | POA: Insufficient documentation

## 2010-11-17 DIAGNOSIS — K029 Dental caries, unspecified: Secondary | ICD-10-CM | POA: Insufficient documentation

## 2010-11-17 DIAGNOSIS — E119 Type 2 diabetes mellitus without complications: Secondary | ICD-10-CM | POA: Insufficient documentation

## 2010-11-17 DIAGNOSIS — E079 Disorder of thyroid, unspecified: Secondary | ICD-10-CM | POA: Insufficient documentation

## 2010-11-17 DIAGNOSIS — I10 Essential (primary) hypertension: Secondary | ICD-10-CM | POA: Insufficient documentation

## 2010-11-17 DIAGNOSIS — J4489 Other specified chronic obstructive pulmonary disease: Secondary | ICD-10-CM | POA: Insufficient documentation

## 2010-11-17 DIAGNOSIS — K089 Disorder of teeth and supporting structures, unspecified: Secondary | ICD-10-CM | POA: Insufficient documentation

## 2010-11-17 DIAGNOSIS — K0381 Cracked tooth: Secondary | ICD-10-CM | POA: Insufficient documentation

## 2010-11-17 DIAGNOSIS — IMO0002 Reserved for concepts with insufficient information to code with codable children: Secondary | ICD-10-CM | POA: Insufficient documentation

## 2010-11-17 DIAGNOSIS — F3289 Other specified depressive episodes: Secondary | ICD-10-CM | POA: Insufficient documentation

## 2010-11-17 DIAGNOSIS — J449 Chronic obstructive pulmonary disease, unspecified: Secondary | ICD-10-CM | POA: Insufficient documentation

## 2010-11-17 MED ORDER — OXYCODONE-ACETAMINOPHEN 5-325 MG PO TABS: ORAL_TABLET | ORAL | Status: DC

## 2010-11-17 MED ORDER — OXYCODONE-ACETAMINOPHEN 5-325 MG PO TABS
1.0000 | ORAL_TABLET | Freq: Once | ORAL | Status: AC
  Administered 2010-11-17: 1 via ORAL
  Filled 2010-11-17: qty 1

## 2010-11-17 MED ORDER — PENICILLIN V POTASSIUM 250 MG PO TABS
500.0000 mg | ORAL_TABLET | Freq: Once | ORAL | Status: AC
  Administered 2010-11-17: 500 mg via ORAL
  Filled 2010-11-17: qty 2

## 2010-11-17 MED ORDER — PENICILLIN V POTASSIUM 500 MG PO TABS: 500.0000 mg | ORAL_TABLET | Freq: Four times a day (QID) | ORAL | Status: AC

## 2010-11-17 NOTE — ED Provider Notes (Signed)
History     CSN: [id number] Arrival date & time: 11/17/2010  1:53 PM   First MD Initiated Contact with Patient 11/17/10 1400      Chief Complaint  Patient presents with  . Dental Injury    (Consider location/radiation/quality/duration/timing/severity/associated sxs/prior treatment) HPI Comments: Decayed L lower 2nd molar has broken and irritating edge of tongue.  Patient is a 45 y.o. female presenting with dental injury. The history is provided by the patient. No language interpreter was used.  Dental Injury This is a new problem. The problem occurs constantly. The problem has been gradually worsening. The symptoms are aggravated by eating. She has tried acetaminophen (dental wax) for the symptoms. The treatment provided no relief.    Past Medical History  Diagnosis Date  . DDD (degenerative disc disease)   . COPD (chronic obstructive pulmonary disease)   . Diabetes mellitus   . Hypertension   . Thyroid disease   . Depression     Past Surgical History  Procedure Date  . Abdominal hysterectomy   . Cesarean section     No family history on file.  History  Substance Use Topics  . Smoking status: Current Everyday Smoker -- 0.5 packs/day    Types: Cigarettes  . Smokeless tobacco: Not on file  . Alcohol Use: No    OB History    Grav Para Term Preterm Abortions TAB SAB Ect Mult Living                  Review of Systems  HENT: Positive for dental problem.   All other systems reviewed and are negative.    Allergies  Flexeril; Keflex; Morphine and related; Ultram; Nsaids; Sulfa antibiotics; and Codeine  Home Medications   Current Outpatient Rx  Name Route Sig Dispense Refill  . ACETAMINOPHEN 500 MG PO TABS Oral Take 2,000 mg by mouth once as needed. For fever and for pain. Patient states that she sometimes takes 6 tablets in one dose for pain.     . ATENOLOL 100 MG PO TABS Oral Take 100 mg by mouth daily.      . CHOLECALCIFEROL 2000 UNITS PO CAPS Oral Take 1  capsule by mouth daily.      Marland Kitchen CLONAZEPAM 1 MG PO TABS Oral Take 1 mg by mouth 3 (three) times daily as needed. For anxiety and panic attacks     . COD LIVER OIL PO CAPS Oral Take 1 capsule by mouth daily.      Marland Kitchen ESCITALOPRAM OXALATE 20 MG PO TABS Oral Take 20 mg by mouth every morning.     Marland Kitchen OMEGA-3 FATTY ACIDS 1000 MG PO CAPS Oral Take 1 g by mouth daily.     Marland Kitchen HYDROCODONE-ACETAMINOPHEN 7.5-500 MG PO TABS Oral Take 1 tablet by mouth every 6 (six) hours as needed.      . IPRATROPIUM BROMIDE HFA 17 MCG/ACT IN AERS Inhalation Inhale 2 puffs into the lungs every 6 (six) hours.      . IPRATROPIUM BROMIDE 0.02 % IN SOLN Nebulization Take 500 mcg by nebulization 4 (four) times daily.      Marland Kitchen LEVOTHYROXINE SODIUM 25 MCG PO TABS Oral Take 25 mcg by mouth daily.      Marland Kitchen METFORMIN HCL 1000 MG PO TABS Oral Take 1,000 mg by mouth 2 (two) times daily with a meal.      . METFORMIN HCL ER 500 MG PO TB24 Oral Take 500 mg by mouth 2 (two) times daily.      Marland Kitchen  PRAVASTATIN SODIUM 40 MG PO TABS Oral Take 40 mg by mouth at bedtime.     Marland Kitchen VITAMIN C 500 MG PO TABS Oral Take 1,500 mg by mouth daily.       BP 117/81  Pulse 71  Temp(Src) 98.2 F (36.8 C) (Oral)  Resp 16  Ht 4' (1.219 m)  Wt 132 lb (59.875 kg)  BMI 40.28 kg/m2  SpO2 98%  Physical Exam  Nursing note and vitals reviewed. Constitutional: She is oriented to person, place, and time. She appears well-developed and well-nourished. No distress.  HENT:  Head: Normocephalic and atraumatic.  Mouth/Throat: Abnormal dentition. Dental caries present. No dental abscesses or uvula swelling.    Eyes: EOM are normal.  Neck: Normal range of motion.  Cardiovascular: Normal rate, regular rhythm and normal heart sounds.   Pulmonary/Chest: Effort normal and breath sounds normal.  Abdominal: Soft. She exhibits no distension. There is no tenderness.  Musculoskeletal: Normal range of motion.  Neurological: She is alert and oriented to person, place, and time.    Skin: Skin is warm and dry.  Psychiatric: She has a normal mood and affect. Judgment normal.    ED Course  Procedures (including critical care time)  Labs Reviewed - No data to display No results found.   No diagnosis found.    MDM          Worthy Rancher, PA 11/17/10 [phone]

## 2010-11-17 NOTE — ED Provider Notes (Signed)
Medical screening examination/treatment/procedure(s) were performed by non-physician practitioner and as supervising physician I was immediately available for consultation/collaboration.   Joya Gaskins, MD 11/17/10 [phone]

## 2010-11-17 NOTE — ED Notes (Signed)
Pt presents with left lower broken tooth. Pt states it is cutting tongue. Pt states she does not have insurance and will not go to the dentist.

## 2011-01-06 ENCOUNTER — Emergency Department (HOSPITAL_COMMUNITY)
Admission: EM | Admit: 2011-01-06 | Discharge: 2011-01-06 | Disposition: A | Discharge status: Home / Self Care | Payer: Self-pay | Attending: Emergency Medicine | Admitting: Emergency Medicine

## 2011-01-06 ENCOUNTER — Encounter (HOSPITAL_COMMUNITY): Payer: Self-pay

## 2011-01-06 ENCOUNTER — Emergency Department (HOSPITAL_COMMUNITY): Payer: Self-pay

## 2011-01-06 DIAGNOSIS — F329 Major depressive disorder, single episode, unspecified: Secondary | ICD-10-CM | POA: Insufficient documentation

## 2011-01-06 DIAGNOSIS — I10 Essential (primary) hypertension: Secondary | ICD-10-CM | POA: Insufficient documentation

## 2011-01-06 DIAGNOSIS — M25569 Pain in unspecified knee: Secondary | ICD-10-CM | POA: Insufficient documentation

## 2011-01-06 DIAGNOSIS — J4489 Other specified chronic obstructive pulmonary disease: Secondary | ICD-10-CM | POA: Insufficient documentation

## 2011-01-06 DIAGNOSIS — F3289 Other specified depressive episodes: Secondary | ICD-10-CM | POA: Insufficient documentation

## 2011-01-06 DIAGNOSIS — E119 Type 2 diabetes mellitus without complications: Secondary | ICD-10-CM | POA: Insufficient documentation

## 2011-01-06 DIAGNOSIS — M545 Low back pain, unspecified: Secondary | ICD-10-CM | POA: Insufficient documentation

## 2011-01-06 DIAGNOSIS — M1990 Unspecified osteoarthritis, unspecified site: Secondary | ICD-10-CM | POA: Insufficient documentation

## 2011-01-06 DIAGNOSIS — E079 Disorder of thyroid, unspecified: Secondary | ICD-10-CM | POA: Insufficient documentation

## 2011-01-06 DIAGNOSIS — J449 Chronic obstructive pulmonary disease, unspecified: Secondary | ICD-10-CM | POA: Insufficient documentation

## 2011-01-06 DIAGNOSIS — S335XXA Sprain of ligaments of lumbar spine, initial encounter: Secondary | ICD-10-CM | POA: Insufficient documentation

## 2011-01-06 DIAGNOSIS — Z79899 Other long term (current) drug therapy: Secondary | ICD-10-CM | POA: Insufficient documentation

## 2011-01-06 MED ORDER — OXYCODONE-ACETAMINOPHEN 5-325 MG PO TABS: 1.0000 | ORAL_TABLET | ORAL | Status: AC | PRN
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
## 2011-01-06 NOTE — ED Notes (Signed)
Patient with no complaints at this time. Respirations even and unlabored. Skin warm/dry. Discharge instructions reviewed with patient at this time. Patient given opportunity to voice concerns/ask questions. Patient discharged at this time and left Emergency Department with steady gait.   

## 2011-01-10 NOTE — ED Provider Notes (Signed)
Medical screening examination/treatment/procedure(s) were performed by non-physician practitioner and as supervising physician I was immediately available for consultation/collaboration.  Nicholes Stairs, MD 01/10/11 1109

## 2011-03-05 ENCOUNTER — Emergency Department (HOSPITAL_COMMUNITY): Payer: Self-pay

## 2011-03-05 ENCOUNTER — Emergency Department (HOSPITAL_COMMUNITY)
Admission: EM | Admit: 2011-03-05 | Discharge: 2011-03-05 | Disposition: A | Discharge status: Home / Self Care | Payer: Self-pay | Attending: Emergency Medicine | Admitting: Emergency Medicine

## 2011-03-05 ENCOUNTER — Encounter (HOSPITAL_COMMUNITY): Payer: Self-pay

## 2011-03-05 DIAGNOSIS — I10 Essential (primary) hypertension: Secondary | ICD-10-CM | POA: Insufficient documentation

## 2011-03-05 DIAGNOSIS — E119 Type 2 diabetes mellitus without complications: Secondary | ICD-10-CM | POA: Insufficient documentation

## 2011-03-05 DIAGNOSIS — IMO0002 Reserved for concepts with insufficient information to code with codable children: Secondary | ICD-10-CM | POA: Insufficient documentation

## 2011-03-05 DIAGNOSIS — Z87891 Personal history of nicotine dependence: Secondary | ICD-10-CM | POA: Insufficient documentation

## 2011-03-05 DIAGNOSIS — J449 Chronic obstructive pulmonary disease, unspecified: Secondary | ICD-10-CM

## 2011-03-05 DIAGNOSIS — F3289 Other specified depressive episodes: Secondary | ICD-10-CM | POA: Insufficient documentation

## 2011-03-05 DIAGNOSIS — F329 Major depressive disorder, single episode, unspecified: Secondary | ICD-10-CM | POA: Insufficient documentation

## 2011-03-05 DIAGNOSIS — E079 Disorder of thyroid, unspecified: Secondary | ICD-10-CM | POA: Insufficient documentation

## 2011-03-05 DIAGNOSIS — Z9079 Acquired absence of other genital organ(s): Secondary | ICD-10-CM | POA: Insufficient documentation

## 2011-03-05 DIAGNOSIS — J4489 Other specified chronic obstructive pulmonary disease: Secondary | ICD-10-CM | POA: Insufficient documentation

## 2011-03-05 LAB — DIFFERENTIAL
Basophils Absolute: 0 10*3/uL (ref 0.0–0.1)
Eosinophils Absolute: 0.1 10*3/uL (ref 0.0–0.7)
Lymphocytes Relative: 11 % — ABNORMAL LOW (ref 12–46)
Lymphs Abs: 1.2 10*3/uL (ref 0.7–4.0)
Neutrophils Relative %: 82 % — ABNORMAL HIGH (ref 43–77)

## 2011-03-05 LAB — COMPREHENSIVE METABOLIC PANEL
ALT: 15 U/L (ref 0–35)
AST: 14 U/L (ref 0–37)
Alkaline Phosphatase: 60 U/L (ref 39–117)
CO2: 27 mEq/L (ref 19–32)
GFR calc Af Amer: >90 mL/min (ref >90)
Glucose, Bld: 135 mg/dL — ABNORMAL HIGH (ref 70–99)
Potassium: 3.5 mEq/L (ref 3.5–5.1)
Sodium: 137 mEq/L (ref 135–145)
Total Protein: 7.2 g/dL (ref 6.0–8.3)

## 2011-03-05 LAB — CBC
Platelets: 200 10*3/uL (ref 150–400)
RBC: 4.3 MIL/uL (ref 3.87–5.11)
RDW: 13.6 % (ref 11.5–15.5)
WBC: 11.5 10*3/uL — ABNORMAL HIGH (ref 4.0–10.5)

## 2011-03-05 MED ORDER — HYDROCODONE-ACETAMINOPHEN 5-325 MG PO TABS
1.0000 | ORAL_TABLET | Freq: Once | ORAL | Status: AC
  Administered 2011-03-05: 1 via ORAL
  Filled 2011-03-05: qty 1

## 2011-03-05 MED ORDER — IPRATROPIUM BROMIDE 0.02 % IN SOLN
0.5000 mg | Freq: Once | RESPIRATORY_TRACT | Status: AC
  Administered 2011-03-05: 0.5 mg via RESPIRATORY_TRACT
  Filled 2011-03-05: qty 2.5

## 2011-03-05 MED ORDER — SODIUM CHLORIDE 0.9 % IV SOLN
Freq: Once | INTRAVENOUS | Status: AC
  Administered 2011-03-05: 12:00:00 via INTRAVENOUS

## 2011-03-05 MED ORDER — METHYLPREDNISOLONE SODIUM SUCC 125 MG IJ SOLR
125.0000 mg | Freq: Once | INTRAMUSCULAR | Status: AC
  Administered 2011-03-05: 125 mg via INTRAVENOUS
  Filled 2011-03-05: qty 2

## 2011-03-05 MED ORDER — ALBUTEROL SULFATE (5 MG/ML) 0.5% IN NEBU
2.5000 mg | INHALATION_SOLUTION | Freq: Once | RESPIRATORY_TRACT | Status: AC
  Administered 2011-03-05: 2.5 mg via RESPIRATORY_TRACT
  Filled 2011-03-05: qty 0.5

## 2011-03-05 MED ORDER — HYDROCODONE-ACETAMINOPHEN 5-325 MG PO TABS: 1.0000 | ORAL_TABLET | ORAL | Status: AC | PRN

## 2011-03-05 MED ORDER — ONDANSETRON HCL 4 MG/2ML IJ SOLN
4.0000 mg | Freq: Once | INTRAMUSCULAR | Status: AC
  Administered 2011-03-05: 4 mg via INTRAVENOUS
  Filled 2011-03-05: qty 2

## 2011-03-05 NOTE — ED Notes (Signed)
Pt reports cough, sob, generalized body aches and fever since yesterday.  Pt reports has coughed until vomits and c/o back pain and rib pain with coughing.

## 2011-03-05 NOTE — ED Provider Notes (Signed)
History   This chart was scribed for [employer]. Colon Branch, MD by Clarita Crane. The patient was seen in room APA04/APA04. Patient's care was started at 1111.    CSN: [id number]  Arrival date & time 03/05/11  1111   First MD Initiated Contact with Patient 03/05/11 1117      Chief Complaint  Patient presents with  . Cough  . Shortness of Breath    (Consider location/radiation/quality/duration/timing/severity/associated sxs/prior treatment) HPI [name] is a 46 y.o. female who presents to the Emergency Department complaining of constant moderate to severe SOB onset yesterday morning and persistent since with associated fever, cough, congestion. Reports she has used home nebulizer without relief. Denies nausea, vomiting, chest pain, diaphoresis and recent sick contacts. Patient with a h/o COPD, diabetes, HTN, thyroid disease and is a former smoker.   Past Medical History  Diagnosis Date  . DDD (degenerative disc disease)   . COPD (chronic obstructive pulmonary disease)   . Diabetes mellitus   . Hypertension   . Thyroid disease   . Depression   . DDD (degenerative disc disease)     Past Surgical History  Procedure Date  . Abdominal hysterectomy   . Cesarean section     No family history on file.  History  Substance Use Topics  . Smoking status: Former Smoker -- 0.5 packs/day    Types: Cigarettes  . Smokeless tobacco: Not on file  . Alcohol Use: No    OB History    Grav Para Term Preterm Abortions TAB SAB Ect Mult Living                  Review of Systems 10 Systems reviewed and are negative for acute change except as noted in the HPI.  Allergies  Flexeril; Keflex; Morphine and related; Ultram; Nsaids; Sulfa antibiotics; and Codeine  Home Medications   Current Outpatient Rx  Name Route Sig Dispense Refill  . ACETAMINOPHEN 500 MG PO TABS Oral Take 2,000 mg by mouth once as needed. For fever and for pain. Patient states that she sometimes takes 6 tablets  in one dose for pain.     . ATENOLOL 100 MG PO TABS Oral Take 100 mg by mouth daily.      . CHOLECALCIFEROL 2000 UNITS PO CAPS Oral Take 1 capsule by mouth daily.      Marland Kitchen CLONAZEPAM 1 MG PO TABS Oral Take 1 mg by mouth 3 (three) times daily as needed. For anxiety and panic attacks     . COD LIVER OIL PO CAPS Oral Take 1 capsule by mouth daily.      Marland Kitchen ESCITALOPRAM OXALATE 20 MG PO TABS Oral Take 20 mg by mouth every morning.     Marland Kitchen OMEGA-3 FATTY ACIDS 1000 MG PO CAPS Oral Take 1 g by mouth daily.     . IPRATROPIUM BROMIDE HFA 17 MCG/ACT IN AERS Inhalation Inhale 2 puffs into the lungs every 6 (six) hours.      . IPRATROPIUM BROMIDE 0.02 % IN SOLN Nebulization Take 500 mcg by nebulization 4 (four) times daily.      Marland Kitchen LEVOTHYROXINE SODIUM 25 MCG PO TABS Oral Take 25 mcg by mouth daily.      Marland Kitchen METFORMIN HCL 1000 MG PO TABS Oral Take 1,000 mg by mouth 2 (two) times daily with a meal.      . OXYCODONE-ACETAMINOPHEN 5-500 MG PO CAPS Oral Take 1 capsule by mouth every 4 (four) hours as needed. pain     .  PRAVASTATIN SODIUM 40 MG PO TABS Oral Take 40 mg by mouth at bedtime.     Marland Kitchen VITAMIN C 500 MG PO TABS Oral Take 1,500 mg by mouth daily.       BP 125/73  Pulse 87  Temp(Src) 101 F (38.3 C) (Oral)  Resp 28  Ht 4' (1.219 m)  Wt 123 lb (55.792 kg)  BMI 37.53 kg/m2  SpO2 95%  Physical Exam  Nursing note and vitals reviewed. Constitutional: She is oriented to person, place, and time. She appears well-developed and well-nourished.  HENT:  Head: Normocephalic and atraumatic.  Eyes: EOM are normal. Pupils are equal, round, and reactive to light.  Neck: Neck supple. No tracheal deviation present.  Cardiovascular: Normal rate and regular rhythm.  Exam reveals no gallop and no friction rub.   No murmur heard. Pulmonary/Chest: Effort normal. No respiratory distress. She has wheezes (diffusely throught all lung fields).       Crackles at bilateral bases. Actively coughing during exam.   Abdominal:  Soft. She exhibits no distension.  Musculoskeletal: Normal range of motion. She exhibits no edema.  Neurological: She is alert and oriented to person, place, and time. No sensory deficit.  Skin: Skin is warm and dry.  Psychiatric: She has a normal mood and affect. Her behavior is normal.    ED Course  Procedures (including critical care time)  DIAGNOSTIC STUDIES: Oxygen Saturation is 99% on room air, normal by my interpretation.    COORDINATION OF CARE: 11:30AM- Patient informed of current plan for treatment and evaluation and agrees with plan at this time.     Results for orders placed during the hospital encounter of 03/05/11  CBC      Component Value Range   WBC 11.5 (*) 4.0 - 10.5 (K/uL)   RBC 4.30  3.87 - 5.11 (MIL/uL)   Hemoglobin 13.2  12.0 - 15.0 (g/dL)   HCT 16.1  09.6 - 04.5 (%)   MCV 92.6  78.0 - 100.0 (fL)   MCH 30.7  26.0 - 34.0 (pg)   MCHC 33.2  30.0 - 36.0 (g/dL)   RDW 40.9  81.1 - 91.4 (%)   Platelets 200  150 - 400 (K/uL)  DIFFERENTIAL      Component Value Range   Neutrophils Relative 82 (*) 43 - 77 (%)   Neutro Abs 9.5 (*) 1.7 - 7.7 (K/uL)   Lymphocytes Relative 11 (*) 12 - 46 (%)   Lymphs Abs 1.2  0.7 - 4.0 (K/uL)   Monocytes Relative 7  3 - 12 (%)   Monocytes Absolute 0.8  0.1 - 1.0 (K/uL)   Eosinophils Relative 0  0 - 5 (%)   Eosinophils Absolute 0.1  0.0 - 0.7 (K/uL)   Basophils Relative 0  0 - 1 (%)   Basophils Absolute 0.0  0.0 - 0.1 (K/uL)  COMPREHENSIVE METABOLIC PANEL      Component Value Range   Sodium 137  135 - 145 (mEq/L)   Potassium 3.5  3.5 - 5.1 (mEq/L)   Chloride 99  96 - 112 (mEq/L)   CO2 27  19 - 32 (mEq/L)   Glucose, Bld 135 (*) 70 - 99 (mg/dL)   BUN 5 (*) 6 - 23 (mg/dL)   Creatinine, Ser 7.82  0.50 - 1.10 (mg/dL)   Calcium 9.1  8.4 - 95.6 (mg/dL)   Total Protein 7.2  6.0 - 8.3 (g/dL)   Albumin 3.4 (*) 3.5 - 5.2 (g/dL)   AST 14  0 -  37 (U/L)   ALT 15  0 - 35 (U/L)   Alkaline Phosphatase 60  39 - 117 (U/L)   Total Bilirubin  0.3  0.3 - 1.2 (mg/dL)   GFR calc non Af Amer >90  >90 (mL/min)   GFR calc Af Amer >90  >90 (mL/min)    Dg Chest Port 1 View  03/05/2011  *RADIOLOGY REPORT*  Clinical Data: cough, fever.  PORTABLE CHEST - 1 VIEW  Comparison: None  Findings: Minimal atelectasis in the lung bases.  Heart is normal size.  No effusions.  No acute bony abnormality.  IMPRESSION: Bibasilar atelectasis.  Original Report Authenticated By: Cyndie Chime, M.D.    MDM  Patient with h/o COPD who has had coughing and wheezing since yesterday. Given albuterol/atrovent nebulizer treatment. Initiated steroid therapy.Given IVF, antiemetic. Breathing improved and wheezing resolved. Labs were unremarkablePt stable in ED with no significant deterioration in condition.The patient appears reasonably screened and/or stabilized for discharge and I doubt any other medical condition or other [facility] requiring further screening, evaluation, or treatment in the ED at this time prior to discharge.  I personally performed the services described in this documentation, which was scribed in my presence. The recorded information has been reviewed and considered.  MDM Reviewed: nursing note and vitals Interpretation: labs and x-ray         Nicoletta Dress. Colon Branch, MD 03/07/11 1308

## 2011-03-05 NOTE — Discharge Instructions (Signed)
USE YOUR NEBULIZER FOUR TIMES A DAY. USE THE PAIN MEDICINE AS NEEDED.   Chronic Obstructive Pulmonary Disease Chronic obstructive pulmonary disease (COPD) is a lung disease. The lungs become damaged, making it hard to get air in and out of your lungs. The damage to your lungs cannot be changed.  HOME CARE  Stop smoking if you smoke. Avoid secondhand smoke.   Only take medicine as told by your doctor.   Talk to your doctor about using cough syrup or over-the-counter medicines.   Drink enough fluids to keep your pee (urine) clear or pale yellow.   Use a humidifier or vaporizer. This may help loosen the thick spit (mucus).   Talk to your doctor about vaccines that help prevent other lung problems (pneumonia and flu vaccines).   Use home oxygen as told by your doctor.   Stay active and exercise.   Eat healthy foods.  GET HELP RIGHT AWAY IF:   Your heart is beating fast.   You become disturbed, confused, shake, or are dazed.   You have trouble breathing.   You have chest pain.   You have a fever.   You cough up thick spit that is yellowish-white or green.   Your breathing becomes worse when you exercise.   You are running out of the medicine you take for your breathing.  MAKE SURE YOU:   Understand these instructions.   Will watch your condition.   Will get help right away if you are not doing well or get worse.  Document Released: 06/11/2007 Document Revised: 09/04/2010 Document Reviewed: 02/22/2010 [facility] Patient Information 2012 [place], [place].

## 2011-03-06 NOTE — ED Notes (Signed)
Pt called to er after discharge stated that her d/c papers stated she needed oxygen, and that she had been using her friends.  D/c instructions reviewed w/ pt and friend, verbalized understanding.  Advised to follow up w/ md, stated she did not have one, advised to check w/ health department or could call local md's to see if accepting neew patient. Verbalized understanding of all

## 2011-04-17 ENCOUNTER — Encounter (HOSPITAL_COMMUNITY): Payer: Self-pay | Admitting: Emergency Medicine

## 2011-04-17 ENCOUNTER — Emergency Department (HOSPITAL_COMMUNITY)
Admission: EM | Admit: 2011-04-17 | Discharge: 2011-04-17 | Disposition: A | Discharge status: Home / Self Care | Payer: Self-pay | Attending: Emergency Medicine | Admitting: Emergency Medicine

## 2011-04-17 DIAGNOSIS — I10 Essential (primary) hypertension: Secondary | ICD-10-CM | POA: Insufficient documentation

## 2011-04-17 DIAGNOSIS — J069 Acute upper respiratory infection, unspecified: Secondary | ICD-10-CM

## 2011-04-17 DIAGNOSIS — J449 Chronic obstructive pulmonary disease, unspecified: Secondary | ICD-10-CM | POA: Insufficient documentation

## 2011-04-17 DIAGNOSIS — E079 Disorder of thyroid, unspecified: Secondary | ICD-10-CM | POA: Insufficient documentation

## 2011-04-17 DIAGNOSIS — Z79899 Other long term (current) drug therapy: Secondary | ICD-10-CM | POA: Insufficient documentation

## 2011-04-17 DIAGNOSIS — J4489 Other specified chronic obstructive pulmonary disease: Secondary | ICD-10-CM | POA: Insufficient documentation

## 2011-04-17 DIAGNOSIS — E119 Type 2 diabetes mellitus without complications: Secondary | ICD-10-CM | POA: Insufficient documentation

## 2011-04-17 DIAGNOSIS — J020 Streptococcal pharyngitis: Secondary | ICD-10-CM | POA: Insufficient documentation

## 2011-04-17 DIAGNOSIS — R112 Nausea with vomiting, unspecified: Secondary | ICD-10-CM | POA: Insufficient documentation

## 2011-04-17 MED ORDER — ONDANSETRON HCL 8 MG PO TABS: 8.0000 mg | ORAL_TABLET | Freq: Three times a day (TID) | ORAL | Status: AC | PRN

## 2011-04-17 MED ORDER — OXYCODONE-ACETAMINOPHEN 5-325 MG PO TABS: 1.0000 | ORAL_TABLET | ORAL | Status: AC | PRN

## 2011-04-17 MED ORDER — PREDNISONE 20 MG PO TABS: 40.0000 mg | ORAL_TABLET | Freq: Every day | ORAL | Status: DC

## 2011-04-17 MED ORDER — ALBUTEROL SULFATE HFA 108 (90 BASE) MCG/ACT IN AERS: 2.0000 | INHALATION_SPRAY | RESPIRATORY_TRACT | Status: DC | PRN

## 2011-04-17 MED ORDER — DEXAMETHASONE SODIUM PHOSPHATE 4 MG/ML IJ SOLN
12.0000 mg | Freq: Once | INTRAMUSCULAR | Status: AC
  Administered 2011-04-17: 12 mg via INTRAMUSCULAR
  Filled 2011-04-17: qty 3

## 2011-04-17 MED ORDER — ONDANSETRON 8 MG PO TBDP
8.0000 mg | ORAL_TABLET | Freq: Once | ORAL | Status: AC
  Administered 2011-04-17: 8 mg via ORAL
  Filled 2011-04-17: qty 1

## 2011-04-17 MED ORDER — PENICILLIN G BENZATHINE 1200000 UNIT/2ML IM SUSP
1.2000 10*6.[IU] | Freq: Once | INTRAMUSCULAR | Status: AC
  Administered 2011-04-17: 1.2 10*6.[IU] via INTRAMUSCULAR
  Filled 2011-04-17: qty 2

## 2011-04-17 MED ORDER — DIPHENHYDRAMINE HCL 12.5 MG/5ML PO ELIX
25.0000 mg | ORAL_SOLUTION | Freq: Once | ORAL | Status: AC
  Administered 2011-04-17: 12.5 mg via ORAL
  Filled 2011-04-17: qty 5

## 2011-04-17 MED ORDER — OXYCODONE-ACETAMINOPHEN 5-325 MG PO TABS
2.0000 | ORAL_TABLET | Freq: Once | ORAL | Status: AC
  Administered 2011-04-17: 2 via ORAL
  Filled 2011-04-17: qty 2

## 2011-04-17 NOTE — Discharge Instructions (Signed)
Clear Liquid Diet The clear liquid dietconsists of foods that are liquid or will become liquid at room temperature.You should be able to see through the liquid and beverages. Examples of foods allowed on a clear liquid diet include fruit juice, broth or bouillon, gelatin, or frozen ice pops. The purpose of this diet is to provide necessary fluid, electrolytes such as sodium and potassium, and energy to keep the body functioning during times when you are not able to consume a regular diet.A clear liquid diet should not be continued for long periods of time as it is not nutritionally adequate.  REASONS FOR USING A CLEAR LIQUID DIET  In sudden onset (acute) conditions for a patient before or after surgery.   As the first step in oral feeding.   For fluid and electrolyte replacement in diarrheal diseases.   As a diet before certain medical tests are performed.  ADEQUACY The clear liquid diet is adequate only in ascorbic acid, according to the Recommended Dietary Allowances of the [employer]. CHOOSING FOODS Breads and Starches  Allowed:  None are allowed.   Avoid: All are avoided.  Vegetables  Allowed:  Strained tomato or vegetable juice.   Avoid: Any others.  Fruit  Allowed:  Strained fruit juices and fruit drinks. Include 1 serving of citrus or vitamin C-enriched fruit juice daily.   Avoid: Any others.  Meat and Meat Substitutes  Allowed:  None are allowed.   Avoid: All are avoided.  Milk  Allowed:  None are allowed.   Avoid: All are avoided.  Soups and Combination Foods  Allowed:  Clear bouillon, broth, or strained broth-based soups.   Avoid: Any others.  Desserts and Sweets  Allowed:  Sugar, honey. High protein gelatin. Flavored gelatin, ices, or frozen ice pops that do not contain milk.   Avoid: Any others.  Fats and Oils  Allowed:  None are allowed.   Avoid: All are avoided.  Beverages  Allowed: Cereal beverages, coffee (regular or  decaffeinated), tea, or soda at the discretion of your caregiver.   Avoid: Any others.  Condiments  Allowed:  Iodized salt.   Avoid: Any others, including pepper.  Supplements  Allowed:  Liquid nutrition beverages.   Avoid: Any others that contain lactose or fiber.  SAMPLE MEAL PLAN Breakfast  4 oz (120 mL) strained orange juice.    to 1 cup (125 to 250 mL) gelatin (plain or fortified).   1 cup (250 mL) beverage (coffee or tea).   Sugar, if desired.  Midmorning Snack   cup (125 mL) gelatin (plain or fortified).  Lunch  1 cup (250 mL) broth or consomm.   4 oz (120 mL) strained grapefruit juice.    cup (125 mL) gelatin (plain or fortified).   1 cup (250 mL) beverage (coffee or tea).   Sugar, if desired.  Midafternoon Snack   cup (125 mL) fruit ice.    cup (125 mL) strained fruit juice.  Dinner  1 cup (250 mL) broth or consomm.    cup (125 mL) cranberry juice.    cup (125 mL) flavored gelatin (plain or fortified).   1 cup (250 mL) beverage (coffee or tea).   Sugar, if desired.  Evening Snack  4 oz (120 mL) strained apple juice (vitamin C-fortified).    cup (125 mL) flavored gelatin (plain or fortified).  Document Released: 12/23/2004 Document Revised: 12/12/2010 Document Reviewed: 03/22/2010 [facility] Patient Information 2012 [place], [place].Cool Mist Vaporizers Vaporizers may help relieve the symptoms of a cough  and cold. By adding water to the air, mucus may become thinner and less sticky. This makes it easier to breathe and cough up secretions. Vaporizers have not been proven to show they help with colds. You should not use a vaporizer if you are allergic to mold. Cool mist vaporizers do not cause serious burns like hot mist vaporizers ("steamers"). HOME CARE INSTRUCTIONS  Follow the package instructions for your vaporizer.   Use a vaporizer that holds a large volume of water (1 to 2 gallons [5.7 to 7.5 liters]).   Do not use anything other  than distilled water in the vaporizer.   Do not run the vaporizer all of the time. This can cause mold or bacteria to grow in the vaporizer.   Clean the vaporizer after each time you use it.   Clean and dry the vaporizer well before you store it.   Stop using a vaporizer if you develop worsening respiratory symptoms.  Document Released: 09/20/2003 Document Revised: 12/12/2010 Document Reviewed: 08/17/2008 [facility] Patient Information 2012 [place], [place].Nausea and Vomiting Nausea is a sick feeling that often comes before throwing up (vomiting). Vomiting is a reflex where stomach contents come out of your mouth. Vomiting can cause severe loss of body fluids (dehydration). Children and elderly adults can become dehydrated quickly, especially if they also have diarrhea. Nausea and vomiting are symptoms of a condition or disease. It is important to find the cause of your symptoms. CAUSES   Direct irritation of the stomach lining. This irritation can result from increased acid production (gastroesophageal reflux disease), infection, food poisoning, taking certain medicines (such as nonsteroidal anti-inflammatory drugs), alcohol use, or tobacco use.   Signals from the brain.These signals could be caused by a headache, heat exposure, an inner ear disturbance, increased pressure in the brain from injury, infection, a tumor, or a concussion, pain, emotional stimulus, or metabolic problems.   An obstruction in the gastrointestinal tract (bowel obstruction).   Illnesses such as diabetes, hepatitis, gallbladder problems, appendicitis, kidney problems, cancer, sepsis, atypical symptoms of a heart attack, or eating disorders.   Medical treatments such as chemotherapy and radiation.   Receiving medicine that makes you sleep (general anesthetic) during surgery.  DIAGNOSIS Your caregiver may ask for tests to be done if the problems do not improve after a few days. Tests may also be done if symptoms are  severe or if the reason for the nausea and vomiting is not clear. Tests may include:  Urine tests.   Blood tests.   Stool tests.   Cultures (to look for evidence of infection).   X-rays or other imaging studies.  Test results can help your caregiver make decisions about treatment or the need for additional tests. TREATMENT You need to stay well hydrated. Drink frequently but in small amounts.You may wish to drink water, sports drinks, clear broth, or eat frozen ice pops or gelatin dessert to help stay hydrated.When you eat, eating slowly may help prevent nausea.There are also some antinausea medicines that may help prevent nausea. HOME CARE INSTRUCTIONS   Take all medicine as directed by your caregiver.   If you do not have an appetite, do not force yourself to eat. However, you must continue to drink fluids.   If you have an appetite, eat a normal diet unless your caregiver tells you differently.   Eat a variety of complex carbohydrates (rice, wheat, potatoes, bread), lean meats, yogurt, fruits, and vegetables.   Avoid high-fat foods because they are more difficult to digest.  Drink enough water and fluids to keep your urine clear or pale yellow.   If you are dehydrated, ask your caregiver for specific rehydration instructions. Signs of dehydration may include:   Severe thirst.   Dry lips and mouth.   Dizziness.   Dark urine.   Decreasing urine frequency and amount.   Confusion.   Rapid breathing or pulse.  SEEK IMMEDIATE MEDICAL CARE IF:   You have blood or brown flecks (like coffee grounds) in your vomit.   You have black or bloody stools.   You have a severe headache or stiff neck.   You are confused.   You have severe abdominal pain.   You have chest pain or trouble breathing.   You do not urinate at least once every 8 hours.   You develop cold or clammy skin.   You continue to vomit for longer than 24 to 48 hours.   You have a fever.  MAKE  SURE YOU:   Understand these instructions.   Will watch your condition.   Will get help right away if you are not doing well or get worse.  Document Released: 12/23/2004 Document Revised: 12/12/2010 Document Reviewed: 05/22/2010 [facility] Patient Information 2012 [place], [place].Saline Nose Drops  To help clear a stuffy nose, put salt water (saline) nose drops in your infant's nose. This helps to loosen the secretions in the nose. Use a bulb syringe to clean the nose out:  Before feeding.   Before putting your infant down for naps.   No more than once every 3 hours to avoid irritating your infant's nostrils.  HOME CARE  Buy nose drops at your local drug store. You can also make nose drops yourself. Mix 1 cup of water with  teaspoon of salt. Stir. Store this mixture at room temperature. Make a new batch daily.   To use the drops:   Put 1 or 2 drops in each side of infant's nose with a clean medicine dropper. Do not use this dropper for any other medicine.   Squeeze the air out of the suction bulb before inserting it into your infant's nose.   While still squeezing the bulb flat, place the tip of the bulb into a nostril. Let air come back into the bulb. The suction will pull snot out of the nose and into the bulb.   Repeat on other nostril.   Squeeze the bulb several times into a tissue and wash the bulb tip in soapy water. Store the bulb with the tip side down on paper towel.   Use the bulb syringe with only the saline drops to avoid irritating your infant's nostrils.  GET HELP RIGHT AWAY IF:  The snot changes to green or yellow.   The snot gets thicker.   Your infant is 3 months or younger with a rectal temperature of 100.4 F (38 C) or higher.   Your infant is older than 3 months with a rectal temperature of 102 F (38.9 C) or higher.   The stuffy nose lasts 10 days or longer.   There is trouble breathing or feeding.  MAKE SURE YOU:  Understand these instructions.     Will watch your infant's condition.   Will get help right away if your infant is not doing well or gets worse.  Document Released: 10/20/2008 Document Revised: 12/12/2010 Document Reviewed: 10/20/2008 [facility] Patient Information 2012 [place], [place].Pharyngitis, Viral and Bacterial Pharyngitis is soreness (inflammation) or infection of the pharynx. It is also called a sore  throat. CAUSES  Most sore throats are caused by viruses and are part of a cold. However, some sore throats are caused by strep and other bacteria. Sore throats can also be caused by post nasal drip from draining sinuses, allergies and sometimes from sleeping with an open mouth. Infectious sore throats can be spread from person to person by coughing, sneezing and sharing cups or eating utensils. TREATMENT  Sore throats that are viral usually last 3-4 days. Viral illness will get better without medications (antibiotics). Strep throat and other bacterial infections will usually begin to get better about 24-48 hours after you begin to take antibiotics. HOME CARE INSTRUCTIONS   If the caregiver feels there is a bacterial infection or if there is a positive strep test, they will prescribe an antibiotic. The full course of antibiotics must be taken. If the full course of antibiotic is not taken, you or your child may become ill again. If you or your child has strep throat and do not finish all of the medication, serious heart or kidney diseases may develop.   Drink enough water and fluids to keep your urine clear or pale yellow.   Only take over-the-counter or prescription medicines for pain, discomfort or fever as directed by your caregiver.   Get lots of rest.   Gargle with salt water ( tsp. of salt in a glass of water) as often as every 1-2 hours as you need for comfort.   Hard candies may soothe the throat if individual is not at risk for choking. Throat sprays or lozenges may also be used.  SEEK MEDICAL CARE IF:    Large, tender lumps in the neck develop.   A rash develops.   Green, yellow-brown or bloody sputum is coughed up.   Your baby is older than 3 months with a rectal temperature of 100.5 F (38.1 C) or higher for more than 1 day.  SEEK IMMEDIATE MEDICAL CARE IF:   A stiff neck develops.   You or your child are drooling or unable to swallow liquids.   You or your child are vomiting, unable to keep medications or liquids down.   You or your child has severe pain, unrelieved with recommended medications.   You or your child are having difficulty breathing (not due to stuffy nose).   You or your child are unable to fully open your mouth.   You or your child develop redness, swelling, or severe pain anywhere on the neck.   You have a fever.   Your baby is older than 3 months with a rectal temperature of 102 F (38.9 C) or higher.   Your baby is 89 months old or younger with a rectal temperature of 100.4 F (38 C) or higher.  MAKE SURE YOU:   Understand these instructions.   Will watch your condition.   Will get help right away if you are not doing well or get worse.  Document Released: 12/23/2004 Document Revised: 12/12/2010 Document Reviewed: 03/22/2007 [facility] Patient Information 2012 [place], [place].Strep Throat Strep throat is an infection of the throat caused by a bacteria named Streptococcus pyogenes. Your caregiver may call the infection streptococcal "tonsillitis" or "pharyngitis" depending on whether there are signs of inflammation in the tonsils or back of the throat. Strep throat is most common in children from 54 to 19 years old during the cold months of the year, but it can occur in people of any age during any season. This infection is spread from person to person (  contagious) through coughing, sneezing, or other close contact. SYMPTOMS   Fever or chills.   Painful, swollen, red tonsils or throat.   Pain or difficulty when swallowing.   White or yellow spots  on the tonsils or throat.   Swollen, tender lymph nodes or "glands" of the neck or under the jaw.   Red rash all over the body (rare).  DIAGNOSIS  Many different infections can cause the same symptoms. A test must be done to confirm the diagnosis so the right treatment can be given. A "rapid strep test" can help your caregiver make the diagnosis in a few minutes. If this test is not available, a light swab of the infected area can be used for a throat culture test. If a throat culture test is done, results are usually available in a day or two. TREATMENT  Strep throat is treated with antibiotic medicine. HOME CARE INSTRUCTIONS   Gargle with 1 tsp of salt in 1 cup of warm water, 3 to 4 times per day or as needed for comfort.   Family members who also have a sore throat or fever should be tested for strep throat and treated with antibiotics if they have the strep infection.   Make sure everyone in your household washes their hands well.   Do not share food, drinking cups, or personal items that could cause the infection to spread to others.   You may need to eat a soft food diet until your sore throat gets better.   Drink enough water and fluids to keep your urine clear or pale yellow. This will help prevent dehydration.   Get plenty of rest.   Stay home from school, daycare, or work until you have been on antibiotics for 24 hours.   Only take over-the-counter or prescription medicines for pain, discomfort, or fever as directed by your caregiver.   If antibiotics are prescribed, take them as directed. Finish them even if you start to feel better.  SEEK MEDICAL CARE IF:   The glands in your neck continue to enlarge.   You develop a rash, cough, or earache.   You cough up green, yellow-brown, or bloody sputum.   You have pain or discomfort not controlled by medicines.   Your problems seem to be getting worse rather than better.  SEEK IMMEDIATE MEDICAL CARE IF:   You develop any  new symptoms such as vomiting, severe headache, stiff or painful neck, chest pain, shortness of breath, or trouble swallowing.   You develop severe throat pain, drooling, or changes in your voice.   You develop swelling of the neck, or the skin on the neck becomes red and tender.   You have a fever.   You develop signs of dehydration, such as fatigue, dry mouth, and decreased urination.   You become increasingly sleepy, or you cannot wake up completely.  Document Released: 12/21/1999 Document Revised: 12/12/2010 Document Reviewed: 02/21/2010 [facility] Patient Information 2012 [place], [place].Salt Water Gargle This solution will help make your mouth and throat feel better. HOME CARE INSTRUCTIONS   Mix 1 teaspoon of salt in 8 ounces of warm water.   Gargle with this solution as much or often as you need or as directed. Swish and gargle gently if you have any sores or wounds in your mouth.   Do not swallow this mixture.  Document Released: 09/27/2003 Document Revised: 12/12/2010 Document Reviewed: 02/18/2008 [facility] Patient Information 2012 [place], [place]. RESOURCE GUIDE  Dental Problems  Patients with Medicaid: [facility]  Dentistry                     [employer] [phone] [address]                                           [phone] [address] Phone:  [phone]                                                  Phone:  [phone]  If unable to pay or uninsured, contact:  [facility] or [facility]. to become qualified for the adult dental clinic.  Chronic Pain Problems Contact [facility]  [phone] Patients need to be referred by their primary care doctor.  Insufficient Money for Medicine Contact [employer]:  call "211" or [facility] [phone].  No Primary Care Doctor Call [facility]  [phone] Other agencies that provide inexpensive medical care    [facility]  [phone]    [facility] Internal Medicine   [phone]    [facility]  [phone]    [facility]  [phone]    Planned Parenthood  2238336164    [facility]  [phone]  Psychological Services [facility]  [phone] [facility] Services  [phone] [facility]   [phone] (emergency services [phone])  Substance Abuse Resources [facility]  [phone] [facility] [phone] [facility] [place] [phone] Floydene Flock [phone] Residential & Outpatient Substance Abuse Program  [phone]  Abuse/Neglect [facility] Child Abuse Hotline 914-454-3990 [facility] ([facility] Child Abuse Hotline 424-741-6393 (After Hours)  Emergency Shelter [facility] Minist[facility] 260-637-4851  Maternity Homes [facility] at the [place] [facility] [phone] [facility] 516-687-0497  MRSA Hotline #:   501-497-5878    [facility] Resources  [facility] [place]     [employer]                          [facility] Dept. [address] [place]                       [address]      [address]  Blondell Reveal Phone:  [phone]                                   Phone:  [phone]                 Phone:  [phone]  [facility] Mental Health Phone:  [phone]  [facility] Child Abuse Hotline 639-119-6291 404-825-4573 (After  Hours)

## 2011-04-17 NOTE — ED Notes (Signed)
Pt reports sore throat for 3 days.  Fever at times

## 2011-04-17 NOTE — ED Provider Notes (Signed)
History  This chart was scribed for Tanya Bonier, MD by Bennett Scrape and Cherlynn Perches. This patient was seen in room APA04/APA04 and the patient's care was started at 9:48AM.   CSN: [id number]  Arrival date & time 04/17/11  0804   First MD Initiated Contact with Patient 04/17/11 [phone]      Chief Complaint  Patient presents with  . Sore Throat  . Fever  . Nasal Congestion  . Cough    The history is provided by the patient. No language interpreter was used.    SHYLYN Summers is a 46 y.o. female who presents to the Emergency Department complaining of 3 days of constant, sudden onset, unchanging sore throat with associated nasal congestion, fever, nausea, emesis, mild diarrhea, and hemoptysis. Pt states that emesis began before other symptoms, which appeared shortly after. Pt reports having "swollen, spotty glands". Pt states that her maximum fever was 102.9 over the past few days. Pt reports taking tylenol before reporting to the ED with slight fever relief. No other modifying factors were reported. Pt has not seen her PCP for this problem. Pt denies diaphoresis, dysuria, chest pain, wheezing, confusion, and HA. Pt has h/o COPD, diabetes, and HTN. Pt is a former smoker and denies alcohol use.    Past Medical History  Diagnosis Date  . DDD (degenerative disc disease)   . COPD (chronic obstructive pulmonary disease)   . Diabetes mellitus   . Hypertension   . Thyroid disease   . Depression   . DDD (degenerative disc disease)     Past Surgical History  Procedure Date  . Abdominal hysterectomy   . Cesarean section     History reviewed. No pertinent family history.  History  Substance Use Topics  . Smoking status: Former Smoker -- 0.5 packs/day    Types: Cigarettes  . Smokeless tobacco: Not on file  . Alcohol Use: No     Review of Systems  Constitutional: Positive for fever, chills and appetite change.  HENT: Positive for congestion, sore throat and trouble  swallowing.   Respiratory: Positive for cough. Negative for wheezing.   Cardiovascular: Negative for chest pain.  Gastrointestinal: Positive for nausea, vomiting and diarrhea.  Genitourinary: Negative for dysuria.  Neurological: Negative for speech difficulty and headaches.  Psychiatric/Behavioral: Negative for confusion.  All other systems reviewed and are negative.    Allergies  Flexeril; Keflex; Morphine and related; Ultram; Nsaids; Sulfa antibiotics; and Codeine  Home Medications   Current Outpatient Rx  Name Route Sig Dispense Refill  . ACETAMINOPHEN 500 MG PO TABS Oral Take 2,000 mg by mouth once as needed. For fever and for pain. Patient states that she sometimes takes 6 tablets in one dose for pain.     Marland Kitchen ALPRAZOLAM 1 MG PO TABS Oral Take 1 mg by mouth 4 (four) times daily.    . ATENOLOL 100 MG PO TABS Oral Take 100 mg by mouth daily.      . CHOLECALCIFEROL 2000 UNITS PO CAPS Oral Take 1 capsule by mouth daily.      Marland Kitchen CITALOPRAM HYDROBROMIDE 40 MG PO TABS Oral Take 40 mg by mouth daily.    . COD LIVER OIL PO CAPS Oral Take 1 capsule by mouth daily.      . OMEGA-3 FATTY ACIDS 1000 MG PO CAPS Oral Take 1 g by mouth 2 (two) times daily.     Marland Kitchen HYDROCODONE-ACETAMINOPHEN 10-650 MG PO TABS Oral Take 1 tablet by mouth every 4 (four) hours  as needed. pain    . IPRATROPIUM BROMIDE HFA 17 MCG/ACT IN AERS Inhalation Inhale 2 puffs into the lungs every 6 (six) hours.      . IPRATROPIUM BROMIDE 0.02 % IN SOLN Nebulization Take 500 mcg by nebulization 4 (four) times daily.      Marland Kitchen LEVOTHYROXINE SODIUM 25 MCG PO TABS Oral Take 25 mcg by mouth daily.      Marland Kitchen METFORMIN HCL 1000 MG PO TABS Oral Take 1,000 mg by mouth 2 (two) times daily with a meal.      . PRAVASTATIN SODIUM 40 MG PO TABS Oral Take 40 mg by mouth at bedtime.     Marland Kitchen VITAMIN C 500 MG PO TABS Oral Take 1,500 mg by mouth daily.       Triage Vitals: BP 118/78  Pulse 89  Temp(Src) 99.1 F (37.3 C) (Oral)  Resp 18  Ht 4' (1.219 m)   Wt 120 lb (54.432 kg)  BMI 36.62 kg/m2  SpO2 96%  Physical Exam  Nursing note and vitals reviewed. Constitutional: She is oriented to person, place, and time. She appears well-developed and well-nourished.  HENT:  Head: Normocephalic and atraumatic.       Moist mucous membrane, tonsillary edema that is 3+ in size with exudate and erythema suggestive of strep throat. TMs and auditory canals normal bilaterally.  Eyes: Conjunctivae and EOM are normal. Pupils are equal, round, and reactive to light. No scleral icterus.  Neck: Normal range of motion. Neck supple. No JVD present. No tracheal deviation (trachea midline) present.       Enlarged lymph nodes at the anterior cervical chain  Cardiovascular: Normal rate, regular rhythm and normal heart sounds.  Exam reveals no gallop and no friction rub.   No murmur heard.      Pulses full to palpation peripherally  Pulmonary/Chest: Effort normal and breath sounds normal. No respiratory distress. She has no wheezes. She has no rales.       No rhonchi, Lungs are clear to auscultation  Abdominal: Soft. Bowel sounds are normal. She exhibits no distension and no mass. There is no tenderness. There is no rebound and no guarding.  Neurological: She is alert and oriented to person, place, and time. No cranial nerve deficit.  Skin: Skin is warm and dry.  Psychiatric: She has a normal mood and affect. Her behavior is normal.    ED Course  Procedures (including critical care time)  DIAGNOSTIC STUDIES: Oxygen Saturation is 96% on room air, adequate by my interpretation.    COORDINATION OF CARE: 9:52 AM - Informed pt of treatment plan including a penicillin shot for strep throat and steroids with pain medication to control pain. Will also give medication to treat patient's nausea symptoms. Patient agreed with plan.    Labs Reviewed - No data to display No results found.   No diagnosis found.    MDM  The patient has very evident acute  tonsillitis/pharyngitis with tonsillar edema, erythema, exudate, cervical adenopathy. I will treat her based on clinical criteria for streptococcal pharyngitis without confirmatory testing needed to make the diagnosis. I have administered IM penicillin in the ED. I've also treated her with liquid oral antihistamine for topical relief, steroid for decreasing inflammation and therefore pain with swallowing, as well as to treat any component of bronchospasm as the patient also has an apparent viral upper respiratory tract infection with cough and bronchospasm superimposed on asthma. Her lung sounds are clear on auscultation and not suggestive of pneumonia and  no chest x-ray is thought to be needed. The vomiting she is experiencing may be simply a result of a fever, or of the strep throat, or posttussive emesis, or a viral gastroenteritis. Regardless, I will treat her with an anti-emetic. I will discharge the patient home. The patient states her understanding of and agreement with the plan of care. She is currently speaking, swallowing fluid intake as well as oral secretions without difficulty.      I personally performed the services described in this documentation, which was scribed in my presence. The recorded information has been reviewed and considered.   Tanya Bonier, MD 04/17/11 [phone]

## 2011-04-17 NOTE — ED Notes (Signed)
nt reported vs to rn, vital signs rechecked. And recorded in chart. Pt sitting up in bed. Alert.

## 2011-04-17 NOTE — ED Notes (Signed)
Warm blanket given. Awaiting orders from md.

## 2011-04-17 NOTE — ED Notes (Signed)
Patient with no complaints at this time. Respirations even and unlabored. Skin warm/dry. Discharge instructions reviewed with patient at this time. Patient given opportunity to voice concerns/ask questions. Patient discharged at this time and left Emergency Department with steady gait.   

## 2011-04-17 NOTE — ED Notes (Signed)
Pt states symptoms began three days ago. Sick on stomach, fever, sore throat. Last meds for fever this am-tylenol.

## 2011-08-06 ENCOUNTER — Encounter (HOSPITAL_COMMUNITY): Payer: Self-pay | Admitting: *Deleted

## 2011-08-06 ENCOUNTER — Emergency Department (HOSPITAL_COMMUNITY): Payer: Self-pay

## 2011-08-06 ENCOUNTER — Emergency Department (HOSPITAL_COMMUNITY)
Admission: EM | Admit: 2011-08-06 | Discharge: 2011-08-07 | Disposition: A | Discharge status: Home / Self Care | Payer: Self-pay | Attending: Emergency Medicine | Admitting: Emergency Medicine

## 2011-08-06 DIAGNOSIS — J449 Chronic obstructive pulmonary disease, unspecified: Secondary | ICD-10-CM | POA: Insufficient documentation

## 2011-08-06 DIAGNOSIS — I10 Essential (primary) hypertension: Secondary | ICD-10-CM | POA: Insufficient documentation

## 2011-08-06 DIAGNOSIS — E119 Type 2 diabetes mellitus without complications: Secondary | ICD-10-CM | POA: Insufficient documentation

## 2011-08-06 DIAGNOSIS — E079 Disorder of thyroid, unspecified: Secondary | ICD-10-CM | POA: Insufficient documentation

## 2011-08-06 DIAGNOSIS — IMO0002 Reserved for concepts with insufficient information to code with codable children: Secondary | ICD-10-CM | POA: Insufficient documentation

## 2011-08-06 DIAGNOSIS — S20219A Contusion of unspecified front wall of thorax, initial encounter: Secondary | ICD-10-CM | POA: Insufficient documentation

## 2011-08-06 DIAGNOSIS — Z87891 Personal history of nicotine dependence: Secondary | ICD-10-CM | POA: Insufficient documentation

## 2011-08-06 DIAGNOSIS — J4489 Other specified chronic obstructive pulmonary disease: Secondary | ICD-10-CM | POA: Insufficient documentation

## 2011-08-06 LAB — URINE MICROSCOPIC-ADD ON

## 2011-08-06 LAB — URINALYSIS, ROUTINE W REFLEX MICROSCOPIC
Glucose, UA: NEGATIVE mg/dL
Nitrite: NEGATIVE
Specific Gravity, Urine: 1.02 (ref 1.005–1.030)
pH: 6 (ref 5.0–8.0)

## 2011-08-06 MED ORDER — OXYCODONE-ACETAMINOPHEN 5-325 MG PO TABS
1.0000 | ORAL_TABLET | Freq: Once | ORAL | Status: AC
  Administered 2011-08-06: 1 via ORAL
  Filled 2011-08-06: qty 1

## 2011-08-06 MED ORDER — OXYCODONE-ACETAMINOPHEN 5-325 MG PO TABS: 1.0000 | ORAL_TABLET | ORAL | Status: AC | PRN
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
## 2011-08-11 MED FILL — Oxycodone w/ Acetaminophen Tab 5-325 MG: ORAL | Qty: 6 | Status: AC

## 2011-10-26 ENCOUNTER — Emergency Department (HOSPITAL_COMMUNITY)
Admission: EM | Admit: 2011-10-26 | Discharge: 2011-10-26 | Disposition: A | Discharge status: Home / Self Care | Payer: Self-pay | Attending: Emergency Medicine | Admitting: Emergency Medicine

## 2011-10-26 ENCOUNTER — Encounter (HOSPITAL_COMMUNITY): Payer: Self-pay | Admitting: Emergency Medicine

## 2011-10-26 DIAGNOSIS — J4489 Other specified chronic obstructive pulmonary disease: Secondary | ICD-10-CM | POA: Insufficient documentation

## 2011-10-26 DIAGNOSIS — Z87891 Personal history of nicotine dependence: Secondary | ICD-10-CM | POA: Insufficient documentation

## 2011-10-26 DIAGNOSIS — M778 Other enthesopathies, not elsewhere classified: Secondary | ICD-10-CM

## 2011-10-26 DIAGNOSIS — I10 Essential (primary) hypertension: Secondary | ICD-10-CM | POA: Insufficient documentation

## 2011-10-26 DIAGNOSIS — IMO0002 Reserved for concepts with insufficient information to code with codable children: Secondary | ICD-10-CM | POA: Insufficient documentation

## 2011-10-26 DIAGNOSIS — M65839 Other synovitis and tenosynovitis, unspecified forearm: Secondary | ICD-10-CM | POA: Insufficient documentation

## 2011-10-26 DIAGNOSIS — E119 Type 2 diabetes mellitus without complications: Secondary | ICD-10-CM | POA: Insufficient documentation

## 2011-10-26 DIAGNOSIS — E079 Disorder of thyroid, unspecified: Secondary | ICD-10-CM | POA: Insufficient documentation

## 2011-10-26 DIAGNOSIS — J449 Chronic obstructive pulmonary disease, unspecified: Secondary | ICD-10-CM | POA: Insufficient documentation

## 2011-10-26 MED ORDER — HYDROCODONE-ACETAMINOPHEN 5-325 MG PO TABS
1.0000 | ORAL_TABLET | Freq: Once | ORAL | Status: AC
  Administered 2011-10-26: 1 via ORAL
  Filled 2011-10-26: qty 1

## 2011-10-26 MED ORDER — HYDROCODONE-ACETAMINOPHEN 5-325 MG PO TABS: 1.0000 | ORAL_TABLET | Freq: Four times a day (QID) | ORAL | Status: AC | PRN
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
## 2011-10-26 NOTE — ED Notes (Signed)
Patient c/o left hand pain x 1 week.  Patient denies any injury or trauma.  Patient has active ROM.

## 2011-10-27 NOTE — ED Provider Notes (Signed)
Medical screening examination/treatment/procedure(s) were performed by non-physician practitioner and as supervising physician I was immediately available for consultation/collaboration.   Carleene Cooper III, MD 10/27/11 1230

## 2012-02-09 ENCOUNTER — Emergency Department (HOSPITAL_COMMUNITY): Payer: Self-pay

## 2012-02-09 ENCOUNTER — Encounter (HOSPITAL_COMMUNITY): Payer: Self-pay | Admitting: *Deleted

## 2012-02-09 ENCOUNTER — Emergency Department (HOSPITAL_COMMUNITY)
Admission: EM | Admit: 2012-02-09 | Discharge: 2012-02-09 | Disposition: A | Discharge status: Home / Self Care | Payer: Self-pay | Attending: Emergency Medicine | Admitting: Emergency Medicine

## 2012-02-09 DIAGNOSIS — F17200 Nicotine dependence, unspecified, uncomplicated: Secondary | ICD-10-CM | POA: Insufficient documentation

## 2012-02-09 DIAGNOSIS — M549 Dorsalgia, unspecified: Secondary | ICD-10-CM | POA: Insufficient documentation

## 2012-02-09 DIAGNOSIS — J3489 Other specified disorders of nose and nasal sinuses: Secondary | ICD-10-CM | POA: Insufficient documentation

## 2012-02-09 DIAGNOSIS — F3289 Other specified depressive episodes: Secondary | ICD-10-CM | POA: Insufficient documentation

## 2012-02-09 DIAGNOSIS — J441 Chronic obstructive pulmonary disease with (acute) exacerbation: Secondary | ICD-10-CM | POA: Insufficient documentation

## 2012-02-09 DIAGNOSIS — E079 Disorder of thyroid, unspecified: Secondary | ICD-10-CM | POA: Insufficient documentation

## 2012-02-09 DIAGNOSIS — I10 Essential (primary) hypertension: Secondary | ICD-10-CM | POA: Insufficient documentation

## 2012-02-09 DIAGNOSIS — Z8739 Personal history of other diseases of the musculoskeletal system and connective tissue: Secondary | ICD-10-CM | POA: Insufficient documentation

## 2012-02-09 DIAGNOSIS — J449 Chronic obstructive pulmonary disease, unspecified: Secondary | ICD-10-CM | POA: Insufficient documentation

## 2012-02-09 DIAGNOSIS — R112 Nausea with vomiting, unspecified: Secondary | ICD-10-CM | POA: Insufficient documentation

## 2012-02-09 DIAGNOSIS — F329 Major depressive disorder, single episode, unspecified: Secondary | ICD-10-CM | POA: Insufficient documentation

## 2012-02-09 DIAGNOSIS — E119 Type 2 diabetes mellitus without complications: Secondary | ICD-10-CM | POA: Insufficient documentation

## 2012-02-09 DIAGNOSIS — Z79899 Other long term (current) drug therapy: Secondary | ICD-10-CM | POA: Insufficient documentation

## 2012-02-09 DIAGNOSIS — R509 Fever, unspecified: Secondary | ICD-10-CM | POA: Insufficient documentation

## 2012-02-09 DIAGNOSIS — J4489 Other specified chronic obstructive pulmonary disease: Secondary | ICD-10-CM | POA: Insufficient documentation

## 2012-02-09 MED ORDER — IPRATROPIUM BROMIDE 0.02 % IN SOLN
0.5000 mg | Freq: Once | RESPIRATORY_TRACT | Status: AC
  Administered 2012-02-09: 0.5 mg via RESPIRATORY_TRACT
  Filled 2012-02-09: qty 2.5

## 2012-02-09 MED ORDER — AZITHROMYCIN 250 MG PO TABS: ORAL_TABLET | ORAL | Status: DC

## 2012-02-09 MED ORDER — HYDROCODONE-ACETAMINOPHEN 5-325 MG PO TABS: 1.0000 | ORAL_TABLET | ORAL | Status: DC | PRN

## 2012-02-09 MED ORDER — HYDROCODONE-ACETAMINOPHEN 5-325 MG PO TABS
1.0000 | ORAL_TABLET | Freq: Once | ORAL | Status: AC
  Administered 2012-02-09: 1 via ORAL
  Filled 2012-02-09: qty 1

## 2012-02-09 MED ORDER — PREDNISONE 50 MG PO TABS
60.0000 mg | ORAL_TABLET | Freq: Once | ORAL | Status: AC
  Administered 2012-02-09: 60 mg via ORAL
  Filled 2012-02-09: qty 1

## 2012-02-09 MED ORDER — ALBUTEROL SULFATE (5 MG/ML) 0.5% IN NEBU
5.0000 mg | INHALATION_SOLUTION | Freq: Once | RESPIRATORY_TRACT | Status: AC
  Administered 2012-02-09: 5 mg via RESPIRATORY_TRACT
  Filled 2012-02-09: qty 1

## 2012-02-09 MED ORDER — PREDNISONE 20 MG PO TABS: 40.0000 mg | ORAL_TABLET | Freq: Every day | ORAL | Status: DC

## 2012-02-09 MED ORDER — ALBUTEROL SULFATE (5 MG/ML) 0.5% IN NEBU
5.0000 mg | INHALATION_SOLUTION | Freq: Once | RESPIRATORY_TRACT | Status: AC
  Administered 2012-02-09: 5 mg via RESPIRATORY_TRACT

## 2012-02-09 NOTE — ED Provider Notes (Signed)
History     CSN: [id number]  Arrival date & time 02/09/12  1653   First MD Initiated Contact with Patient 02/09/12 1713      Chief Complaint  Patient presents with  . Cough    (Consider location/radiation/quality/duration/timing/severity/associated sxs/prior treatment) HPI Comments: [name] is a 47 y.o. Female presenting with a 1 week history of cough,  Shortness of breath and constant throbbing and burning pain bilaterally in her back,  Which is worse with coughing.  Cough has been sometimes productive of a black sputum.  She does have a history of emphysema and uses inhalers and nebulizers,  Her last dose was at 2:30 pm, then albuterol inhaler around 3 pm, with slight relief.  She has had fever to 100. Degrees,  She has had post tussive emesis,  But also reports nausea which is mild without vomiting.  She has had no abdominal pain, no diarrhea.      The history is provided by the patient.    Past Medical History  Diagnosis Date  . DDD (degenerative disc disease)   . COPD (chronic obstructive pulmonary disease)   . Diabetes mellitus   . Hypertension   . Thyroid disease   . Depression   . DDD (degenerative disc disease)     Past Surgical History  Procedure Date  . Abdominal hysterectomy   . Cesarean section     History reviewed. No pertinent family history.  History  Substance Use Topics  . Smoking status: Current Every Day Smoker -- 0.5 packs/day    Types: Cigarettes  . Smokeless tobacco: Not on file  . Alcohol Use: No    OB History    Grav Para Term Preterm Abortions TAB SAB Ect Mult Living                  Review of Systems  Constitutional: Positive for fever.  HENT: Positive for congestion and rhinorrhea. Negative for sore throat, trouble swallowing and neck pain.   Eyes: Negative.   Respiratory: Positive for cough, shortness of breath and wheezing. Negative for chest tightness.   Cardiovascular: Negative for chest pain.  Gastrointestinal:  Positive for nausea. Negative for abdominal pain.  Genitourinary: Negative.   Musculoskeletal: Negative for joint swelling and arthralgias.  Skin: Negative.  Negative for rash and wound.  Neurological: Negative for dizziness, weakness, light-headedness, numbness and headaches.  Hematological: Negative.   Psychiatric/Behavioral: Negative.     Allergies  Cephalexin; Flexeril; Morphine and related; Ultram; Nsaids; Sulfa antibiotics; and Codeine  Home Medications   Current Outpatient Rx  Name  Route  Sig  Dispense  Refill  . ACETAMINOPHEN 500 MG PO TABS   Oral   Take 2,000 mg by mouth daily as needed. For fever and for pain. Patient states that she sometimes takes 6 tablets in one dose for pain.         . ALBUTEROL SULFATE HFA 108 (90 BASE) MCG/ACT IN AERS   Inhalation   Inhale 2 puffs into the lungs every 4 (four) hours as needed for wheezing or shortness of breath.   1 Inhaler   0   . ALPRAZOLAM 2 MG PO TABS   Oral   Take 2 mg by mouth at bedtime.          . ATENOLOL 100 MG PO TABS   Oral   Take 100 mg by mouth every morning.          . CHOLECALCIFEROL 2000 UNITS PO CAPS   Oral  Take 1 capsule by mouth every morning.          Marland Kitchen CITALOPRAM HYDROBROMIDE 40 MG PO TABS   Oral   Take 40 mg by mouth every morning.          . COD LIVER OIL PO CAPS   Oral   Take 1 capsule by mouth every morning.          Marland Kitchen OMEGA-3 FATTY ACIDS 1000 MG PO CAPS   Oral   Take 1 g by mouth 2 (two) times daily.          Marland Kitchen GLUCOSAMINE CHONDR COMPLEX PO   Oral   Take 1 tablet by mouth 2 (two) times daily.         Marland Kitchen HYDROCODONE-ACETAMINOPHEN 10-650 MG PO TABS   Oral   Take 1 tablet by mouth every 4 (four) hours as needed. pain         . IPRATROPIUM BROMIDE HFA 17 MCG/ACT IN AERS   Inhalation   Inhale 2 puffs into the lungs every 6 (six) hours.           . IPRATROPIUM BROMIDE 0.02 % IN SOLN   Nebulization   Take 500 mcg by nebulization 4 (four) times daily.            Marland Kitchen LEVOTHYROXINE SODIUM 25 MCG PO TABS   Oral   Take 25 mcg by mouth every morning.          Marland Kitchen METFORMIN HCL ER 500 MG PO TB24   Oral   Take 500 mg by mouth 2 (two) times daily.         Marland Kitchen PRAVASTATIN SODIUM 40 MG PO TABS   Oral   Take 40 mg by mouth at bedtime.          Marland Kitchen VITAMIN C 500 MG PO TABS   Oral   Take 1,500 mg by mouth daily.          . AZITHROMYCIN 250 MG PO TABS      Take 2 tablets by mouth on day one followed by one tablet daily for 4 days.   6 tablet   0   . HYDROCODONE-ACETAMINOPHEN 5-325 MG PO TABS   Oral   Take 1 tablet by mouth every 4 (four) hours as needed for pain (or cough).   12 tablet   0   . PREDNISONE 20 MG PO TABS   Oral   Take 2 tablets (40 mg total) by mouth daily.   6 tablet   0     BP 145/93  Pulse 72  Temp 98 F (36.7 C) (Oral)  Resp 20  Ht 3\' 11"  (1.194 m)  Wt 125 lb (56.7 kg)  BMI 39.79 kg/m2  SpO2 97%  Physical Exam  Nursing note and vitals reviewed. Constitutional: She appears well-developed and well-nourished.  HENT:  Head: Normocephalic and atraumatic.  Mouth/Throat: Oropharynx is clear and moist.  Eyes: Conjunctivae normal are normal.  Neck: Normal range of motion.  Cardiovascular: Normal rate, regular rhythm, normal heart sounds and intact distal pulses.   Pulmonary/Chest: Effort normal. She has wheezes. She has no rhonchi. She has no rales.   She exhibits tenderness.       Tender to palpation bilateral mid back, parathoracic.  Patient has inspiratory and expiratory wheezing, prolonged expirations throughout all lung fields.  Abdominal: Soft. Bowel sounds are normal. There is no tenderness.  Musculoskeletal: Normal range of motion. She exhibits no edema.  Neurological: She is  alert.  Skin: Skin is warm and dry.  Psychiatric: She has a normal mood and affect.    ED Course  Procedures (including critical care time)\  Patient given albuterol 5 and Atrovent 0.5 nebulizer treatment with no significant  improvement in her wheezing.  She was also given prednisone 60 mg by mouth.  Her wheezing did not significantly improved.  Therefore she was given a repeat albuterol 5 mg nebulizer treatment with him better relief of wheezing, improved airflow the patient more comfortable with her breathing.  Her CBG was checked and was 156. Labs Reviewed - No data to display Dg Chest 2 View  02/09/2012  *RADIOLOGY REPORT*  Clinical Data: Cough.  Body aches.  Dark sputum.  CHEST - 2 VIEW  Comparison: 08/06/2011  Findings: Mildly enlarged cardiopericardial silhouette noted.  Mild airway thickening is observed.  No pleural effusion.  No adenopathy identified.  IMPRESSION:  1.  Mildly enlarged cardiopericardial silhouette. 2.  Mild airway thickening, suggesting bronchitis or reactive airways disease.   Original Report Authenticated By: Gaylyn Rong, M.D.      1. COPD with acute exacerbation       MDM  Patient was prescribed low-dose prednisone 40 mg to take for the next 3 days.  Also given Zithromax, although patient had a normal chest x-ray she does have significant risk factors for pulmonary infection given her diabetes and her COPD.  Additionally she was prescribed hydrocodone to help her with her chest wall pain and to help reduce coughing.  Encouraged followup with her PCP, otherwise return here if her symptoms worsen in any way.        Burgess Amor, [place] 02/09/12 1956

## 2012-02-09 NOTE — ED Notes (Signed)
States she feels much better s/p neb, ready to go home

## 2012-02-09 NOTE — ED Provider Notes (Signed)
Medical screening examination/treatment/procedure(s) were performed by non-physician practitioner and as supervising physician I was immediately available for consultation/collaboration. Devoria Albe, MD, Armando Gang   Ward Givens, MD 02/09/12 [phone]

## 2012-02-09 NOTE — ED Notes (Signed)
Cough , body aches, black sputum

## 2012-03-23 ENCOUNTER — Emergency Department (HOSPITAL_COMMUNITY)
Admission: EM | Admit: 2012-03-23 | Discharge: 2012-03-24 | Disposition: A | Discharge status: Home / Self Care | Payer: Self-pay | Attending: Emergency Medicine | Admitting: Emergency Medicine

## 2012-03-23 DIAGNOSIS — E079 Disorder of thyroid, unspecified: Secondary | ICD-10-CM | POA: Insufficient documentation

## 2012-03-23 DIAGNOSIS — Y939 Activity, unspecified: Secondary | ICD-10-CM | POA: Insufficient documentation

## 2012-03-23 DIAGNOSIS — J449 Chronic obstructive pulmonary disease, unspecified: Secondary | ICD-10-CM | POA: Insufficient documentation

## 2012-03-23 DIAGNOSIS — I10 Essential (primary) hypertension: Secondary | ICD-10-CM | POA: Insufficient documentation

## 2012-03-23 DIAGNOSIS — IMO0002 Reserved for concepts with insufficient information to code with codable children: Secondary | ICD-10-CM | POA: Insufficient documentation

## 2012-03-23 DIAGNOSIS — Z79899 Other long term (current) drug therapy: Secondary | ICD-10-CM | POA: Insufficient documentation

## 2012-03-23 DIAGNOSIS — E119 Type 2 diabetes mellitus without complications: Secondary | ICD-10-CM | POA: Insufficient documentation

## 2012-03-23 DIAGNOSIS — F3289 Other specified depressive episodes: Secondary | ICD-10-CM | POA: Insufficient documentation

## 2012-03-23 DIAGNOSIS — F329 Major depressive disorder, single episode, unspecified: Secondary | ICD-10-CM | POA: Insufficient documentation

## 2012-03-23 DIAGNOSIS — F17200 Nicotine dependence, unspecified, uncomplicated: Secondary | ICD-10-CM | POA: Insufficient documentation

## 2012-03-23 DIAGNOSIS — G8929 Other chronic pain: Secondary | ICD-10-CM | POA: Insufficient documentation

## 2012-03-23 DIAGNOSIS — W06XXXA Fall from bed, initial encounter: Secondary | ICD-10-CM | POA: Insufficient documentation

## 2012-03-23 DIAGNOSIS — J4489 Other specified chronic obstructive pulmonary disease: Secondary | ICD-10-CM | POA: Insufficient documentation

## 2012-03-23 DIAGNOSIS — Z8739 Personal history of other diseases of the musculoskeletal system and connective tissue: Secondary | ICD-10-CM | POA: Insufficient documentation

## 2012-03-23 DIAGNOSIS — Y929 Unspecified place or not applicable: Secondary | ICD-10-CM | POA: Insufficient documentation

## 2012-03-24 ENCOUNTER — Emergency Department (HOSPITAL_COMMUNITY): Payer: Self-pay

## 2012-03-24 ENCOUNTER — Encounter (HOSPITAL_COMMUNITY): Payer: Self-pay

## 2012-03-24 LAB — URINALYSIS, ROUTINE W REFLEX MICROSCOPIC
Bilirubin Urine: NEGATIVE
Glucose, UA: NEGATIVE mg/dL
Hgb urine dipstick: NEGATIVE
Ketones, ur: NEGATIVE mg/dL
Protein, ur: NEGATIVE mg/dL
pH: 6 (ref 5.0–8.0)

## 2012-03-24 MED ORDER — HYDROCODONE-ACETAMINOPHEN 5-325 MG PO TABS
2.0000 | ORAL_TABLET | Freq: Once | ORAL | Status: AC
  Administered 2012-03-24: 2 via ORAL
  Filled 2012-03-24: qty 2

## 2012-03-24 MED ORDER — ALBUTEROL SULFATE HFA 108 (90 BASE) MCG/ACT IN AERS
2.0000 | INHALATION_SPRAY | RESPIRATORY_TRACT | Status: DC | PRN
  Administered 2012-03-24: 2 via RESPIRATORY_TRACT
  Filled 2012-03-24: qty 6.7

## 2012-03-24 MED ORDER — HYDROCODONE-ACETAMINOPHEN 5-325 MG PO TABS: 1.0000 | ORAL_TABLET | Freq: Four times a day (QID) | ORAL | Status: DC | PRN

## 2012-03-24 MED ORDER — METHOCARBAMOL 500 MG PO TABS: 500.0000 mg | ORAL_TABLET | Freq: Three times a day (TID) | ORAL | Status: DC | PRN

## 2012-03-24 NOTE — ED Provider Notes (Signed)
History     CSN: [id number]  Arrival date & time 03/23/12  2327   First MD Initiated Contact with Patient 03/24/12 0056      No chief complaint on file.   (Consider location/radiation/quality/duration/timing/severity/associated sxs/prior treatment) HPI [name] is a 47 y.o. female with a history of chronic back pain and degenerative disc disease in lumbar spine presents to the ER with complaints of back pain after falling out of bed last night. She says it's been worse over the last 24 hours, it's currently 8-9/10, located in the lower lumbar spine, bilateral, in the paraspinous muscles, severe. No numbness or tingling that is new, no weakness. No IV drug abuse, no fevers or chills or night sweats.   Past Medical History  Diagnosis Date  . DDD (degenerative disc disease)   . COPD (chronic obstructive pulmonary disease)   . Diabetes mellitus   . Hypertension   . Thyroid disease   . Depression   . DDD (degenerative disc disease)     Past Surgical History  Procedure Laterality Date  . Abdominal hysterectomy    . Cesarean section      No family history on file.  History  Substance Use Topics  . Smoking status: Current Every Day Smoker -- 0.50 packs/day    Types: Cigarettes  . Smokeless tobacco: Not on file  . Alcohol Use: No    OB History   Grav Para Term Preterm Abortions TAB SAB Ect Mult Living                  Review of Systems At least 10pt or greater review of systems completed and are negative except where specified in the HPI.  Allergies  Cephalexin; Flexeril; Morphine and related; Ultram; Nsaids; Sulfa antibiotics; and Codeine  Home Medications   Current Outpatient Rx  Name  Route  Sig  Dispense  Refill  . albuterol (PROVENTIL HFA;VENTOLIN HFA) 108 (90 BASE) MCG/ACT inhaler   Inhalation   Inhale 2 puffs into the lungs every 4 (four) hours as needed for wheezing or shortness of breath.   1 Inhaler   0   . alprazolam (XANAX) 2 MG tablet  Oral   Take 2 mg by mouth at bedtime.          Marland Kitchen atenolol (TENORMIN) 100 MG tablet   Oral   Take 100 mg by mouth every morning.          . Cholecalciferol (HM VITAMIN D3) 2000 UNITS CAPS   Oral   Take 1 capsule by mouth every morning.          . citalopram (CELEXA) 40 MG tablet   Oral   Take 40 mg by mouth every morning.          Marland Kitchen ipratropium (ATROVENT) 0.02 % nebulizer solution   Nebulization   Take 500 mcg by nebulization 4 (four) times daily.           Marland Kitchen levothyroxine (SYNTHROID, LEVOTHROID) 25 MCG tablet   Oral   Take 25 mcg by mouth every morning.          . metFORMIN (GLUCOPHAGE-XR) 500 MG 24 hr tablet   Oral   Take 500 mg by mouth 2 (two) times daily.         . pravastatin (PRAVACHOL) 40 MG tablet   Oral   Take 40 mg by mouth at bedtime.          . vitamin C (ASCORBIC ACID) 500 MG tablet  Oral   Take 1,500 mg by mouth daily.          Marland Kitchen acetaminophen (TYLENOL) 500 MG tablet   Oral   Take 2,000 mg by mouth daily as needed. For fever and for pain. Patient states that she sometimes takes 6 tablets in one dose for pain.         Marland Kitchen azithromycin (ZITHROMAX Z-PAK) 250 MG tablet      Take 2 tablets by mouth on day one followed by one tablet daily for 4 days.   6 tablet   0   . Cod Liver Oil CAPS   Oral   Take 1 capsule by mouth every morning.          . fish oil-omega-3 fatty acids 1000 MG capsule   Oral   Take 1 g by mouth 2 (two) times daily.          . Glucosamine-Chondroitin (GLUCOSAMINE CHONDR COMPLEX PO)   Oral   Take 1 tablet by mouth 2 (two) times daily.         Marland Kitchen HYDROcodone-acetaminophen (LORCET) 10-650 MG per tablet   Oral   Take 1 tablet by mouth every 4 (four) hours as needed. pain         . HYDROcodone-acetaminophen (NORCO/VICODIN) 5-325 MG per tablet   Oral   Take 1 tablet by mouth every 4 (four) hours as needed for pain (or cough).   12 tablet   0   . HYDROcodone-acetaminophen (NORCO/VICODIN) 5-325 MG per  tablet   Oral   Take 1-2 tablets by mouth every 6 (six) hours as needed for pain.   13 tablet   0   . ipratropium (ATROVENT HFA) 17 MCG/ACT inhaler   Inhalation   Inhale 2 puffs into the lungs every 6 (six) hours.           . methocarbamol (ROBAXIN) 500 MG tablet   Oral   Take 1-2 tablets (500-1,000 mg total) by mouth 3 (three) times daily as needed (muscle spasm).   20 tablet   0   . predniSONE (DELTASONE) 20 MG tablet   Oral   Take 2 tablets (40 mg total) by mouth daily.   6 tablet   0     BP 110/70  Pulse 78  Temp(Src) 98.1 F (36.7 C) (Oral)  Ht 3\' 11"  (1.194 m)  SpO2 98%  Physical Exam  Nursing notes reviewed.  Electronic medical record reviewed. VITAL SIGNS:   Filed Vitals:   03/24/12 0010  BP: 110/70  Pulse: 78  Temp: 98.1 F (36.7 C)  TempSrc: Oral  Height: 3\' 11"  (1.194 m)  SpO2: 98%   CONSTITUTIONAL: Awake, oriented, appears non-toxic HENT: Atraumatic, normocephalic, oral mucosa pink and moist, airway patent. Nares patent without drainage. External ears normal. EYES: Conjunctiva clear, EOMI, PERRLA NECK: Trachea midline, non-tender, supple CARDIOVASCULAR: Normal heart rate, Normal rhythm, No murmurs, rubs, gallops PULMONARY/CHEST: Clear to auscultation, no rhonchi, wheezes, or rales. Symmetrical breath sounds. Non-tender. ABDOMINAL: Non-distended, soft, non-tender - no rebound or guarding.  BS normal. NEUROLOGIC: Non-focal, moving all four extremities, no gross sensory or motor deficits. Patellar reflexes are 2+ bilaterally. No clonus. Patient is able to walk unassisted - able to walk on her toes and her heels. BACK: Paraspinous muscle tenderness to palpation in the lumbar region approximately L4-S1 EXTREMITIES: No clubbing, cyanosis, or edema SKIN: Warm, Dry, No erythema, No rash  ED Course  Procedures (including critical care time)  Labs Reviewed  URINALYSIS, ROUTINE W REFLEX  MICROSCOPIC   Dg Lumbar Spine Complete  03/24/2012  *RADIOLOGY  REPORT*  Clinical Data: Larey Seat out of bed yesterday, now with low back pain.  LUMBAR SPINE - COMPLETE 4+ VIEW  Comparison: Lumbar spine x-rays 10/14/2010, 09/17/2010, 08/28/2010.  Findings: Five non-rib bearing lumbar vertebrae with anatomic alignment.  No fractures.  Well-preserved disc spaces.  No pars defects.  Mild facet degenerative changes at L5-S1.  Sacroiliac joints intact with degenerative changes.  No significant interval change.  IMPRESSION: No acute osseous abnormality.  Degenerative changes involving the L5-S1 facet joints and the sacroiliac joints.  Stable examination.   Original Report Authenticated By: Hulan Saas, M.D.      1. Chronic back pain       MDM  KALIOPE QUINONEZ is a 47 y.o. female presents with acute on chronic back pain. Given that this is typically not pretreated back pain that she fell out of bed, will treat the patient with some Norco. Patient says she has an allergy to ibuprofen. Patient has no symptoms consistent with cauda equina syndrome or acute cord compression. Lumbar x-rays obtained given her fall out of bed, no acute changes to her lumbar series, no fractures seen.  No red flags obtained on history physical exam, will be discharged home stable and in good condition with pain medicine. Patient to remain active, call primary care physician.       Jones Skene, MD 03/24/12 [phone]

## 2012-03-24 NOTE — ED Notes (Signed)
Pt reporting pain in lower back from falling out of bed a couple days ago.  Reports chronic back pain, but states it's worse tonight.

## 2012-06-04 ENCOUNTER — Encounter (HOSPITAL_COMMUNITY): Payer: Self-pay | Admitting: Emergency Medicine

## 2012-06-04 ENCOUNTER — Emergency Department (HOSPITAL_COMMUNITY)
Admission: EM | Admit: 2012-06-04 | Discharge: 2012-06-04 | Disposition: A | Discharge status: Home / Self Care | Payer: Self-pay | Attending: Emergency Medicine | Admitting: Emergency Medicine

## 2012-06-04 DIAGNOSIS — G8929 Other chronic pain: Secondary | ICD-10-CM | POA: Insufficient documentation

## 2012-06-04 DIAGNOSIS — Y92009 Unspecified place in unspecified non-institutional (private) residence as the place of occurrence of the external cause: Secondary | ICD-10-CM | POA: Insufficient documentation

## 2012-06-04 DIAGNOSIS — E079 Disorder of thyroid, unspecified: Secondary | ICD-10-CM | POA: Insufficient documentation

## 2012-06-04 DIAGNOSIS — W108XXA Fall (on) (from) other stairs and steps, initial encounter: Secondary | ICD-10-CM | POA: Insufficient documentation

## 2012-06-04 DIAGNOSIS — IMO0002 Reserved for concepts with insufficient information to code with codable children: Secondary | ICD-10-CM | POA: Insufficient documentation

## 2012-06-04 DIAGNOSIS — F3289 Other specified depressive episodes: Secondary | ICD-10-CM | POA: Insufficient documentation

## 2012-06-04 DIAGNOSIS — F17200 Nicotine dependence, unspecified, uncomplicated: Secondary | ICD-10-CM | POA: Insufficient documentation

## 2012-06-04 DIAGNOSIS — Y9389 Activity, other specified: Secondary | ICD-10-CM | POA: Insufficient documentation

## 2012-06-04 DIAGNOSIS — M549 Dorsalgia, unspecified: Secondary | ICD-10-CM

## 2012-06-04 DIAGNOSIS — J449 Chronic obstructive pulmonary disease, unspecified: Secondary | ICD-10-CM | POA: Insufficient documentation

## 2012-06-04 DIAGNOSIS — I10 Essential (primary) hypertension: Secondary | ICD-10-CM | POA: Insufficient documentation

## 2012-06-04 DIAGNOSIS — J4489 Other specified chronic obstructive pulmonary disease: Secondary | ICD-10-CM | POA: Insufficient documentation

## 2012-06-04 DIAGNOSIS — Z79899 Other long term (current) drug therapy: Secondary | ICD-10-CM | POA: Insufficient documentation

## 2012-06-04 DIAGNOSIS — F329 Major depressive disorder, single episode, unspecified: Secondary | ICD-10-CM | POA: Insufficient documentation

## 2012-06-04 DIAGNOSIS — E119 Type 2 diabetes mellitus without complications: Secondary | ICD-10-CM | POA: Insufficient documentation

## 2012-06-04 MED ORDER — PREDNISONE 20 MG PO TABS: 60.0000 mg | ORAL_TABLET | Freq: Every day | ORAL | Status: DC

## 2012-06-04 MED ORDER — PREDNISONE 50 MG PO TABS
60.0000 mg | ORAL_TABLET | Freq: Once | ORAL | Status: AC
  Administered 2012-06-04: 60 mg via ORAL
  Filled 2012-06-04: qty 1

## 2012-06-04 MED ORDER — OXYCODONE-ACETAMINOPHEN 5-325 MG PO TABS
1.0000 | ORAL_TABLET | Freq: Once | ORAL | Status: AC
  Administered 2012-06-04: 1 via ORAL
  Filled 2012-06-04: qty 1

## 2012-06-04 NOTE — ED Provider Notes (Signed)
History     CSN: [id number]  Arrival date & time 06/04/12  4098   First MD Initiated Contact with Patient 06/04/12 [phone]      Chief Complaint  Patient presents with  . Fall  . Back Pain    (Consider location/radiation/quality/duration/timing/severity/associated sxs/prior treatment) HPI History provided by the patient - presents at 6am complaining of back pain after falling from her bed while trying to get into it tonight.  She states her bed is about as tall as she is and she typically pulls herself into her bed using the sheets to climb in. She has chronic back pain, takes percocet, has known DDD and tonight c/o sharp pain not radiating from lower back. No neck pain or LOC or any other injury. No incontinence. She is able to ambulate without any weakness or numbness.  Pain is worse with movement, mod to severe.   Past Medical History  Diagnosis Date  . DDD (degenerative disc disease)   . COPD (chronic obstructive pulmonary disease)   . Diabetes mellitus   . Hypertension   . Thyroid disease   . Depression   . DDD (degenerative disc disease)   . Degenerative disc disease     Past Surgical History  Procedure Laterality Date  . Abdominal hysterectomy    . Cesarean section      No family history on file.  History  Substance Use Topics  . Smoking status: Current Every Day Smoker -- 0.50 packs/day    Types: Cigarettes  . Smokeless tobacco: Not on file  . Alcohol Use: No    OB History   Grav Para Term Preterm Abortions TAB SAB Ect Mult Living                  Review of Systems  Constitutional: Negative for fever and chills.  HENT: Negative for neck pain and neck stiffness.   Eyes: Negative for pain.  Respiratory: Negative for shortness of breath.   Cardiovascular: Negative for chest pain.  Gastrointestinal: Negative for vomiting and abdominal pain.  Genitourinary: Negative for dysuria.  Musculoskeletal: Positive for back pain.  Skin: Negative for rash.   Neurological: Negative for weakness, numbness and headaches.  All other systems reviewed and are negative.    Allergies  Cephalexin; Flexeril; Morphine and related; Ultram; Nsaids; Sulfa antibiotics; and Codeine  Home Medications   Current Outpatient Rx  Name  Route  Sig  Dispense  Refill  . acetaminophen (TYLENOL) 500 MG tablet   Oral   Take 2,000 mg by mouth daily as needed. For fever and for pain. Patient states that she sometimes takes 6 tablets in one dose for pain.         Marland Kitchen alprazolam (XANAX) 2 MG tablet   Oral   Take 2 mg by mouth at bedtime.          Marland Kitchen atenolol (TENORMIN) 100 MG tablet   Oral   Take 100 mg by mouth every morning.          . Cholecalciferol (HM VITAMIN D3) 2000 UNITS CAPS   Oral   Take 1 capsule by mouth every morning.          . citalopram (CELEXA) 40 MG tablet   Oral   Take 40 mg by mouth every morning.          Marland Kitchen Cod Liver Oil CAPS   Oral   Take 1 capsule by mouth every morning.          Marland Kitchen  fish oil-omega-3 fatty acids 1000 MG capsule   Oral   Take 1 g by mouth 2 (two) times daily.          . Glucosamine-Chondroitin (GLUCOSAMINE CHONDR COMPLEX PO)   Oral   Take 1 tablet by mouth 2 (two) times daily.         Marland Kitchen HYDROcodone-acetaminophen (LORCET) 10-650 MG per tablet   Oral   Take 1 tablet by mouth every 4 (four) hours as needed. pain         . HYDROcodone-acetaminophen (NORCO/VICODIN) 5-325 MG per tablet   Oral   Take 1 tablet by mouth every 4 (four) hours as needed for pain (or cough).   12 tablet   0   . HYDROcodone-acetaminophen (NORCO/VICODIN) 5-325 MG per tablet   Oral   Take 1-2 tablets by mouth every 6 (six) hours as needed for pain.   13 tablet   0   . ipratropium (ATROVENT HFA) 17 MCG/ACT inhaler   Inhalation   Inhale 2 puffs into the lungs every 6 (six) hours.           Marland Kitchen levothyroxine (SYNTHROID, LEVOTHROID) 25 MCG tablet   Oral   Take 25 mcg by mouth every morning.          . metFORMIN  (GLUCOPHAGE-XR) 500 MG 24 hr tablet   Oral   Take 500 mg by mouth 2 (two) times daily.         . pravastatin (PRAVACHOL) 40 MG tablet   Oral   Take 40 mg by mouth at bedtime.          . vitamin C (ASCORBIC ACID) 500 MG tablet   Oral   Take 1,500 mg by mouth daily.          Marland Kitchen EXPIRED: albuterol (PROVENTIL HFA;VENTOLIN HFA) 108 (90 BASE) MCG/ACT inhaler   Inhalation   Inhale 2 puffs into the lungs every 4 (four) hours as needed for wheezing or shortness of breath.   1 Inhaler   0   . azithromycin (ZITHROMAX Z-PAK) 250 MG tablet      Take 2 tablets by mouth on day one followed by one tablet daily for 4 days.   6 tablet   0   . ipratropium (ATROVENT) 0.02 % nebulizer solution   Nebulization   Take 500 mcg by nebulization 4 (four) times daily.           . methocarbamol (ROBAXIN) 500 MG tablet   Oral   Take 1-2 tablets (500-1,000 mg total) by mouth 3 (three) times daily as needed (muscle spasm).   20 tablet   0   . predniSONE (DELTASONE) 20 MG tablet   Oral   Take 2 tablets (40 mg total) by mouth daily.   6 tablet   0   . predniSONE (DELTASONE) 20 MG tablet   Oral   Take 3 tablets (60 mg total) by mouth daily.   15 tablet   0     BP 93/73  Pulse 94  Temp(Src) 97.7 F (36.5 C) (Oral)  Resp 20  Ht 3\' 11"  (1.194 m)  Wt 120 lb (54.432 kg)  BMI 38.18 kg/m2  SpO2 100%  Physical Exam  Nursing note and vitals reviewed. Constitutional: She is oriented to person, place, and time. She appears well-developed and well-nourished.  HENT:  Head: Normocephalic and atraumatic.  Eyes: EOM are normal. Pupils are equal, round, and reactive to light.  Neck: Normal range of motion. Neck supple.  Cardiovascular:  Normal rate, normal heart sounds and intact distal pulses.   Pulmonary/Chest: Effort normal and breath sounds normal. No respiratory distress.  Abdominal: Soft. Bowel sounds are normal. She exhibits no distension. There is no tenderness.  Musculoskeletal: Normal  range of motion. She exhibits no edema.  Tender lower paralumbar region, no LE deficits with equal strengths and sensorium to light touch, no lumbar deformity. No lumbar, cervical or thoracic tenderness.   Neurological: She is alert and oriented to person, place, and time. No cranial nerve deficit. Coordination normal.  Skin: Skin is warm and dry.    ED Course  Procedures (including critical care time)    1. Back pain     No indication for imaging based on clinical presentation and exam at this time  MDM  Chronic back pain, now with fall and acute on chronic pain medications provided Back pain precautions provided VS and nurses notes reviewed Old records reviewed - has multiple ER visits for various pain complaints        Sunnie Nielsen, MD 06/04/12 [phone]

## 2012-06-04 NOTE — ED Notes (Signed)
Patient states she fell out of her bed and c/o lower back pain.  Patient ambulatory to room with steady gait.

## 2012-09-29 ENCOUNTER — Emergency Department (HOSPITAL_COMMUNITY)
Admission: EM | Admit: 2012-09-29 | Discharge: 2012-09-29 | Disposition: A | Discharge status: Home / Self Care | Payer: Medicaid Other | Attending: Emergency Medicine | Admitting: Emergency Medicine

## 2012-09-29 ENCOUNTER — Encounter (HOSPITAL_COMMUNITY): Payer: Self-pay | Admitting: Emergency Medicine

## 2012-09-29 ENCOUNTER — Emergency Department (HOSPITAL_COMMUNITY): Payer: Medicaid Other

## 2012-09-29 DIAGNOSIS — IMO0002 Reserved for concepts with insufficient information to code with codable children: Secondary | ICD-10-CM | POA: Diagnosis not present

## 2012-09-29 DIAGNOSIS — E079 Disorder of thyroid, unspecified: Secondary | ICD-10-CM | POA: Insufficient documentation

## 2012-09-29 DIAGNOSIS — F329 Major depressive disorder, single episode, unspecified: Secondary | ICD-10-CM | POA: Diagnosis not present

## 2012-09-29 DIAGNOSIS — R05 Cough: Secondary | ICD-10-CM | POA: Diagnosis present

## 2012-09-29 DIAGNOSIS — I10 Essential (primary) hypertension: Secondary | ICD-10-CM | POA: Insufficient documentation

## 2012-09-29 DIAGNOSIS — E119 Type 2 diabetes mellitus without complications: Secondary | ICD-10-CM | POA: Insufficient documentation

## 2012-09-29 DIAGNOSIS — F17200 Nicotine dependence, unspecified, uncomplicated: Secondary | ICD-10-CM | POA: Diagnosis not present

## 2012-09-29 DIAGNOSIS — J40 Bronchitis, not specified as acute or chronic: Secondary | ICD-10-CM

## 2012-09-29 DIAGNOSIS — J441 Chronic obstructive pulmonary disease with (acute) exacerbation: Secondary | ICD-10-CM

## 2012-09-29 DIAGNOSIS — Z8739 Personal history of other diseases of the musculoskeletal system and connective tissue: Secondary | ICD-10-CM | POA: Insufficient documentation

## 2012-09-29 DIAGNOSIS — F3289 Other specified depressive episodes: Secondary | ICD-10-CM | POA: Insufficient documentation

## 2012-09-29 DIAGNOSIS — Z79899 Other long term (current) drug therapy: Secondary | ICD-10-CM | POA: Diagnosis not present

## 2012-09-29 DIAGNOSIS — R059 Cough, unspecified: Secondary | ICD-10-CM | POA: Diagnosis present

## 2012-09-29 MED ORDER — HYDROCODONE-ACETAMINOPHEN 5-325 MG PO TABS
1.0000 | ORAL_TABLET | Freq: Once | ORAL | Status: AC
  Administered 2012-09-29: 1 via ORAL
  Filled 2012-09-29: qty 1

## 2012-09-29 MED ORDER — PREDNISONE 10 MG PO TABS: 60.0000 mg | ORAL_TABLET | Freq: Every day | ORAL | Status: DC

## 2012-09-29 MED ORDER — PREDNISONE 50 MG PO TABS
60.0000 mg | ORAL_TABLET | Freq: Once | ORAL | Status: AC
  Administered 2012-09-29: 60 mg via ORAL
  Filled 2012-09-29: qty 1

## 2012-09-29 MED ORDER — IPRATROPIUM BROMIDE 0.02 % IN SOLN
0.5000 mg | Freq: Once | RESPIRATORY_TRACT | Status: AC
  Administered 2012-09-29: 0.5 mg via RESPIRATORY_TRACT
  Filled 2012-09-29: qty 2.5

## 2012-09-29 MED ORDER — BENZONATATE 100 MG PO CAPS: 100.0000 mg | ORAL_CAPSULE | Freq: Three times a day (TID) | ORAL | Status: DC

## 2012-09-29 MED ORDER — ALBUTEROL SULFATE (5 MG/ML) 0.5% IN NEBU
5.0000 mg | INHALATION_SOLUTION | Freq: Once | RESPIRATORY_TRACT | Status: AC
  Administered 2012-09-29: 5 mg via RESPIRATORY_TRACT
  Filled 2012-09-29: qty 1

## 2012-09-29 NOTE — ED Notes (Signed)
Patient c/o cough x 3 weeks; states has been on 2 courses of antibiotics.  Patient presents to ER with wheezing and cough.

## 2012-09-29 NOTE — ED Provider Notes (Signed)
CSN: [id number]     Arrival date & time 09/29/12  0049 History   First MD Initiated Contact with Patient 09/29/12 [phone]     Chief Complaint  Patient presents with  . Cough   HPI Patient reports ongoing cough for the past 3 weeks.  She reports 2 recent course of antibiotics without improvement in her cough.  The majority of her plan at this time his cough.  She does report mild shortness of breath.  She continues to smoke cigarettes.  She has a history of COPD.  She is not on oxygen.  She denies sore throat.  No chest discomfort at this time.  No abdominal pain.  No nausea vomiting or diarrhea.  Her symptoms are mild to moderate in severity.  She states her most concerning thing is that every time she coughed she urinates on herself and this has her concerned.   Past Medical History  Diagnosis Date  . DDD (degenerative disc disease)   . COPD (chronic obstructive pulmonary disease)   . Diabetes mellitus   . Hypertension   . Thyroid disease   . Depression   . DDD (degenerative disc disease)   . Degenerative disc disease    Past Surgical History  Procedure Laterality Date  . Abdominal hysterectomy    . Cesarean section     No family history on file. History  Substance Use Topics  . Smoking status: Current Every Day Smoker -- 0.50 packs/day    Types: Cigarettes  . Smokeless tobacco: Not on file  . Alcohol Use: No   OB History   Grav Para Term Preterm Abortions TAB SAB Ect Mult Living                 Review of Systems  All other systems reviewed and are negative.    Allergies  Cephalexin; Flexeril; Morphine and related; Ultram; Nsaids; Sulfa antibiotics; and Codeine  Home Medications   Current Outpatient Rx  Name  Route  Sig  Dispense  Refill  . acetaminophen (TYLENOL) 500 MG tablet   Oral   Take 2,000 mg by mouth daily as needed. For fever and for pain. Patient states that she sometimes takes 6 tablets in one dose for pain.         Marland Kitchen EXPIRED: albuterol (PROVENTIL  HFA;VENTOLIN HFA) 108 (90 BASE) MCG/ACT inhaler   Inhalation   Inhale 2 puffs into the lungs every 4 (four) hours as needed for wheezing or shortness of breath.   1 Inhaler   0   . alprazolam (XANAX) 2 MG tablet   Oral   Take 2 mg by mouth at bedtime.          Marland Kitchen atenolol (TENORMIN) 100 MG tablet   Oral   Take 100 mg by mouth every morning.          Marland Kitchen azithromycin (ZITHROMAX Z-PAK) 250 MG tablet      Take 2 tablets by mouth on day one followed by one tablet daily for 4 days.   6 tablet   0   . benzonatate (TESSALON) 100 MG capsule   Oral   Take 1 capsule (100 mg total) by mouth every 8 (eight) hours.   21 capsule   0   . Cholecalciferol (HM VITAMIN D3) 2000 UNITS CAPS   Oral   Take 1 capsule by mouth every morning.          . citalopram (CELEXA) 40 MG tablet   Oral   Take 40  mg by mouth every morning.          Marland Kitchen Cod Liver Oil CAPS   Oral   Take 1 capsule by mouth every morning.          . fish oil-omega-3 fatty acids 1000 MG capsule   Oral   Take 1 g by mouth 2 (two) times daily.          . Glucosamine-Chondroitin (GLUCOSAMINE CHONDR COMPLEX PO)   Oral   Take 1 tablet by mouth 2 (two) times daily.         Marland Kitchen HYDROcodone-acetaminophen (LORCET) 10-650 MG per tablet   Oral   Take 1 tablet by mouth every 4 (four) hours as needed. pain         . HYDROcodone-acetaminophen (NORCO/VICODIN) 5-325 MG per tablet   Oral   Take 1 tablet by mouth every 4 (four) hours as needed for pain (or cough).   12 tablet   0   . HYDROcodone-acetaminophen (NORCO/VICODIN) 5-325 MG per tablet   Oral   Take 1-2 tablets by mouth every 6 (six) hours as needed for pain.   13 tablet   0   . ipratropium (ATROVENT HFA) 17 MCG/ACT inhaler   Inhalation   Inhale 2 puffs into the lungs every 6 (six) hours.           Marland Kitchen ipratropium (ATROVENT) 0.02 % nebulizer solution   Nebulization   Take 500 mcg by nebulization 4 (four) times daily.           Marland Kitchen levothyroxine  (SYNTHROID, LEVOTHROID) 25 MCG tablet   Oral   Take 25 mcg by mouth every morning.          . metFORMIN (GLUCOPHAGE-XR) 500 MG 24 hr tablet   Oral   Take 500 mg by mouth 2 (two) times daily.         . methocarbamol (ROBAXIN) 500 MG tablet   Oral   Take 1-2 tablets (500-1,000 mg total) by mouth 3 (three) times daily as needed (muscle spasm).   20 tablet   0   . pravastatin (PRAVACHOL) 40 MG tablet   Oral   Take 40 mg by mouth at bedtime.          . predniSONE (DELTASONE) 10 MG tablet   Oral   Take 6 tablets (60 mg total) by mouth daily.   30 tablet   0   . predniSONE (DELTASONE) 20 MG tablet   Oral   Take 2 tablets (40 mg total) by mouth daily.   6 tablet   0   . predniSONE (DELTASONE) 20 MG tablet   Oral   Take 3 tablets (60 mg total) by mouth daily.   15 tablet   0   . vitamin C (ASCORBIC ACID) 500 MG tablet   Oral   Take 1,500 mg by mouth daily.           BP 142/113  Pulse 94  Temp(Src) 99.7 F (37.6 C) (Oral)  Resp 22  Ht 3\' 11"  (1.194 m)  Wt 129 lb (58.514 kg)  BMI 41.04 kg/m2  SpO2 96% Physical Exam  Nursing note and vitals reviewed. Constitutional: She is oriented to person, place, and time. She appears well-developed and well-nourished. No distress.  HENT:  Head: Normocephalic and atraumatic.  Eyes: EOM are normal.  Neck: Normal range of motion.  Cardiovascular: Normal rate, regular rhythm and normal heart sounds.   Pulmonary/Chest: Effort normal. She has wheezes.  Abdominal: Soft. She  exhibits no distension. There is no tenderness.  Musculoskeletal: Normal range of motion.  Neurological: She is alert and oriented to person, place, and time.  Skin: Skin is warm and dry.  Psychiatric: She has a normal mood and affect. Judgment normal.    ED Course  Procedures (including critical care time) Labs Review Labs Reviewed - No data to display Imaging Review Dg Chest 2 View  09/29/2012   CLINICAL DATA:  Cough for 3 weeks. COPD.  EXAM:  CHEST  2 VIEW  COMPARISON:  PA and lateral chest 09/17/2012 and Single view of the chest on 03/05/2011.  FINDINGS: Heart size is upper normal. Lungs clear. No pneumothorax or pleural fluid. No focal bony abnormality.  IMPRESSION: No active cardiopulmonary disease.   Electronically Signed   By: Drusilla Kanner M.D.   On: 09/29/2012 01:26  I personally reviewed the imaging tests through PACS system I reviewed available ER/hospitalization records through the EMR   MDM   1. COPD exacerbation   2. Bronchitis    At discharge the patient feels much better.  Home with treatment for COPD exacerbation.  No indication for antibiotics at this time.    Lyanne Co, MD 09/29/12 [phone]

## 2013-02-24 ENCOUNTER — Encounter (HOSPITAL_COMMUNITY): Payer: Self-pay | Admitting: Emergency Medicine

## 2013-02-24 ENCOUNTER — Emergency Department (HOSPITAL_COMMUNITY): Payer: Medicaid Other

## 2013-02-24 ENCOUNTER — Emergency Department (HOSPITAL_COMMUNITY)
Admission: EM | Admit: 2013-02-24 | Discharge: 2013-02-24 | Disposition: A | Discharge status: Home / Self Care | Payer: Medicaid Other | Attending: Emergency Medicine | Admitting: Emergency Medicine

## 2013-02-24 DIAGNOSIS — E079 Disorder of thyroid, unspecified: Secondary | ICD-10-CM | POA: Insufficient documentation

## 2013-02-24 DIAGNOSIS — F3289 Other specified depressive episodes: Secondary | ICD-10-CM | POA: Insufficient documentation

## 2013-02-24 DIAGNOSIS — S99929A Unspecified injury of unspecified foot, initial encounter: Secondary | ICD-10-CM

## 2013-02-24 DIAGNOSIS — F17200 Nicotine dependence, unspecified, uncomplicated: Secondary | ICD-10-CM | POA: Insufficient documentation

## 2013-02-24 DIAGNOSIS — F329 Major depressive disorder, single episode, unspecified: Secondary | ICD-10-CM | POA: Insufficient documentation

## 2013-02-24 DIAGNOSIS — E119 Type 2 diabetes mellitus without complications: Secondary | ICD-10-CM | POA: Insufficient documentation

## 2013-02-24 DIAGNOSIS — I10 Essential (primary) hypertension: Secondary | ICD-10-CM | POA: Insufficient documentation

## 2013-02-24 DIAGNOSIS — M549 Dorsalgia, unspecified: Secondary | ICD-10-CM

## 2013-02-24 DIAGNOSIS — Z7982 Long term (current) use of aspirin: Secondary | ICD-10-CM | POA: Insufficient documentation

## 2013-02-24 DIAGNOSIS — J449 Chronic obstructive pulmonary disease, unspecified: Secondary | ICD-10-CM | POA: Insufficient documentation

## 2013-02-24 DIAGNOSIS — S99919A Unspecified injury of unspecified ankle, initial encounter: Secondary | ICD-10-CM

## 2013-02-24 DIAGNOSIS — S4980XA Other specified injuries of shoulder and upper arm, unspecified arm, initial encounter: Secondary | ICD-10-CM | POA: Insufficient documentation

## 2013-02-24 DIAGNOSIS — S46909A Unspecified injury of unspecified muscle, fascia and tendon at shoulder and upper arm level, unspecified arm, initial encounter: Secondary | ICD-10-CM | POA: Insufficient documentation

## 2013-02-24 DIAGNOSIS — W19XXXA Unspecified fall, initial encounter: Secondary | ICD-10-CM | POA: Insufficient documentation

## 2013-02-24 DIAGNOSIS — J4489 Other specified chronic obstructive pulmonary disease: Secondary | ICD-10-CM | POA: Insufficient documentation

## 2013-02-24 DIAGNOSIS — Z79899 Other long term (current) drug therapy: Secondary | ICD-10-CM | POA: Insufficient documentation

## 2013-02-24 DIAGNOSIS — Y929 Unspecified place or not applicable: Secondary | ICD-10-CM | POA: Insufficient documentation

## 2013-02-24 DIAGNOSIS — S8990XA Unspecified injury of unspecified lower leg, initial encounter: Secondary | ICD-10-CM | POA: Insufficient documentation

## 2013-02-24 DIAGNOSIS — IMO0002 Reserved for concepts with insufficient information to code with codable children: Secondary | ICD-10-CM | POA: Insufficient documentation

## 2013-02-24 DIAGNOSIS — Y939 Activity, unspecified: Secondary | ICD-10-CM | POA: Insufficient documentation

## 2013-02-24 MED ORDER — ALPRAZOLAM 0.25 MG PO TABS: 0.2500 mg | ORAL_TABLET | Freq: Every evening | ORAL | Status: DC | PRN

## 2013-02-24 MED ORDER — ALPRAZOLAM 0.5 MG PO TABS
0.5000 mg | ORAL_TABLET | Freq: Once | ORAL | Status: AC
  Administered 2013-02-24: 0.5 mg via ORAL
  Filled 2013-02-24: qty 1

## 2013-02-24 MED ORDER — HYDROCODONE-ACETAMINOPHEN 5-325 MG PO TABS
2.0000 | ORAL_TABLET | ORAL | Status: DC | PRN
Start: 2013-02-24 — End: 2014-01-09

## 2013-02-24 MED ORDER — OXYCODONE-ACETAMINOPHEN 5-325 MG PO TABS
2.0000 | ORAL_TABLET | Freq: Once | ORAL | Status: AC
  Administered 2013-02-24: 2 via ORAL
  Filled 2013-02-24: qty 2
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
## 2013-02-24 NOTE — Discharge Instructions (Signed)
Back Exercises Back exercises help treat and prevent back injuries. The goal of back exercises is to increase the strength of your abdominal and back muscles and the flexibility of your back. These exercises should be started when you no longer have back pain. Back exercises include:  Pelvic Tilt. Lie on your back with your knees bent. Tilt your pelvis until the lower part of your back is against the floor. Hold this position 5 to 10 sec and repeat 5 to 10 times.  Knee to Chest. Pull first 1 knee up against your chest and hold for 20 to 30 seconds, repeat this with the other knee, and then both knees. This may be done with the other leg straight or bent, whichever feels better.  Sit-Ups or Curl-Ups. Bend your knees 90 degrees. Start with tilting your pelvis, and do a partial, slow sit-up, lifting your trunk only 30 to 45 degrees off the floor. Take at least 2 to 3 seconds for each sit-up. Do not do sit-ups with your knees out straight. If partial sit-ups are difficult, simply do the above but with only tightening your abdominal muscles and holding it as directed.  Hip-Lift. Lie on your back with your knees flexed 90 degrees. Push down with your feet and shoulders as you raise your hips a couple inches off the floor; hold for 10 seconds, repeat 5 to 10 times.  Back arches. Lie on your stomach, propping yourself up on bent elbows. Slowly press on your hands, causing an arch in your low back. Repeat 3 to 5 times. Any initial stiffness and discomfort should lessen with repetition over time.  Shoulder-Lifts. Lie face down with arms beside your body. Keep hips and torso pressed to floor as you slowly lift your head and shoulders off the floor. Do not overdo your exercises, especially in the beginning. Exercises may cause you some mild back discomfort which lasts for a few minutes; however, if the pain is more severe, or lasts for more than 15 minutes, do not continue exercises until you see your caregiver.  Improvement with exercise therapy for back problems is slow.  See your caregivers for assistance with developing a proper back exercise program. Document Released: 01/31/2004 Document Revised: 03/17/2011 Document Reviewed: 10/24/2010 [facility] Patient Information 2014 [place].   Do back exercises and stretching Take xanax for anxiety in the evening when needed Norco for mod-severe pain only
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
## 2013-03-05 NOTE — ED Provider Notes (Signed)
Medical screening examination/treatment/procedure(s) were performed by non-physician practitioner and as supervising physician I was immediately available for consultation/collaboration.   EKG Interpretation None        Maudry Diego, MD 03/05/13 [phone]

## 2013-05-13 ENCOUNTER — Emergency Department (HOSPITAL_COMMUNITY): Payer: Medicaid Other

## 2013-05-13 ENCOUNTER — Emergency Department (HOSPITAL_COMMUNITY)
Admission: EM | Admit: 2013-05-13 | Discharge: 2013-05-13 | Disposition: A | Discharge status: Home / Self Care | Payer: Medicaid Other | Attending: Emergency Medicine | Admitting: Emergency Medicine

## 2013-05-13 ENCOUNTER — Encounter (HOSPITAL_COMMUNITY): Payer: Self-pay | Admitting: Emergency Medicine

## 2013-05-13 DIAGNOSIS — E119 Type 2 diabetes mellitus without complications: Secondary | ICD-10-CM | POA: Insufficient documentation

## 2013-05-13 DIAGNOSIS — Z79899 Other long term (current) drug therapy: Secondary | ICD-10-CM | POA: Insufficient documentation

## 2013-05-13 DIAGNOSIS — M25519 Pain in unspecified shoulder: Secondary | ICD-10-CM

## 2013-05-13 DIAGNOSIS — F3289 Other specified depressive episodes: Secondary | ICD-10-CM | POA: Insufficient documentation

## 2013-05-13 DIAGNOSIS — J4489 Other specified chronic obstructive pulmonary disease: Secondary | ICD-10-CM | POA: Insufficient documentation

## 2013-05-13 DIAGNOSIS — S60229A Contusion of unspecified hand, initial encounter: Secondary | ICD-10-CM | POA: Insufficient documentation

## 2013-05-13 DIAGNOSIS — I10 Essential (primary) hypertension: Secondary | ICD-10-CM | POA: Insufficient documentation

## 2013-05-13 DIAGNOSIS — IMO0002 Reserved for concepts with insufficient information to code with codable children: Secondary | ICD-10-CM | POA: Insufficient documentation

## 2013-05-13 DIAGNOSIS — Z7982 Long term (current) use of aspirin: Secondary | ICD-10-CM | POA: Insufficient documentation

## 2013-05-13 DIAGNOSIS — F17200 Nicotine dependence, unspecified, uncomplicated: Secondary | ICD-10-CM | POA: Insufficient documentation

## 2013-05-13 DIAGNOSIS — Y929 Unspecified place or not applicable: Secondary | ICD-10-CM | POA: Insufficient documentation

## 2013-05-13 DIAGNOSIS — T148XXA Other injury of unspecified body region, initial encounter: Secondary | ICD-10-CM

## 2013-05-13 DIAGNOSIS — S46909A Unspecified injury of unspecified muscle, fascia and tendon at shoulder and upper arm level, unspecified arm, initial encounter: Secondary | ICD-10-CM | POA: Insufficient documentation

## 2013-05-13 DIAGNOSIS — F329 Major depressive disorder, single episode, unspecified: Secondary | ICD-10-CM | POA: Insufficient documentation

## 2013-05-13 DIAGNOSIS — S6990XA Unspecified injury of unspecified wrist, hand and finger(s), initial encounter: Secondary | ICD-10-CM | POA: Insufficient documentation

## 2013-05-13 DIAGNOSIS — M79643 Pain in unspecified hand: Secondary | ICD-10-CM

## 2013-05-13 DIAGNOSIS — E079 Disorder of thyroid, unspecified: Secondary | ICD-10-CM | POA: Insufficient documentation

## 2013-05-13 DIAGNOSIS — Y9389 Activity, other specified: Secondary | ICD-10-CM | POA: Insufficient documentation

## 2013-05-13 DIAGNOSIS — J449 Chronic obstructive pulmonary disease, unspecified: Secondary | ICD-10-CM | POA: Insufficient documentation

## 2013-05-13 DIAGNOSIS — W230XXA Caught, crushed, jammed, or pinched between moving objects, initial encounter: Secondary | ICD-10-CM | POA: Insufficient documentation

## 2013-05-13 DIAGNOSIS — S4980XA Other specified injuries of shoulder and upper arm, unspecified arm, initial encounter: Secondary | ICD-10-CM | POA: Insufficient documentation

## 2013-05-13 MED ORDER — HYDROCODONE-ACETAMINOPHEN 5-325 MG PO TABS: 1.0000 | ORAL_TABLET | ORAL | Status: DC | PRN

## 2013-05-13 MED ORDER — HYDROCODONE-ACETAMINOPHEN 5-325 MG PO TABS
1.0000 | ORAL_TABLET | Freq: Once | ORAL | Status: AC
  Administered 2013-05-13: 1 via ORAL
  Filled 2013-05-13: qty 1

## 2013-05-13 NOTE — ED Provider Notes (Signed)
Medical screening examination/treatment/procedure(s) were performed by non-physician practitioner and as supervising physician I was immediately available for consultation/collaboration.   EKG Interpretation None        Alfonzo Feller, DO 05/13/13 2110

## 2013-05-13 NOTE — Discharge Instructions (Signed)
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
## 2013-11-07 ENCOUNTER — Encounter (HOSPITAL_COMMUNITY): Payer: Self-pay | Admitting: Emergency Medicine

## 2014-01-09 ENCOUNTER — Emergency Department (HOSPITAL_COMMUNITY)
Admission: EM | Admit: 2014-01-09 | Discharge: 2014-01-09 | Disposition: A | Discharge status: Home / Self Care | Payer: Medicaid Other | Attending: Emergency Medicine | Admitting: Emergency Medicine

## 2014-01-09 ENCOUNTER — Emergency Department (HOSPITAL_COMMUNITY): Payer: Medicaid Other

## 2014-01-09 ENCOUNTER — Encounter (HOSPITAL_COMMUNITY): Payer: Self-pay | Admitting: Emergency Medicine

## 2014-01-09 DIAGNOSIS — Z90710 Acquired absence of both cervix and uterus: Secondary | ICD-10-CM | POA: Diagnosis not present

## 2014-01-09 DIAGNOSIS — J449 Chronic obstructive pulmonary disease, unspecified: Secondary | ICD-10-CM | POA: Diagnosis not present

## 2014-01-09 DIAGNOSIS — R52 Pain, unspecified: Secondary | ICD-10-CM

## 2014-01-09 DIAGNOSIS — F329 Major depressive disorder, single episode, unspecified: Secondary | ICD-10-CM | POA: Diagnosis not present

## 2014-01-09 DIAGNOSIS — Z79899 Other long term (current) drug therapy: Secondary | ICD-10-CM | POA: Diagnosis not present

## 2014-01-09 DIAGNOSIS — Z7982 Long term (current) use of aspirin: Secondary | ICD-10-CM | POA: Diagnosis not present

## 2014-01-09 DIAGNOSIS — Z720 Tobacco use: Secondary | ICD-10-CM | POA: Insufficient documentation

## 2014-01-09 DIAGNOSIS — R319 Hematuria, unspecified: Secondary | ICD-10-CM

## 2014-01-09 DIAGNOSIS — I10 Essential (primary) hypertension: Secondary | ICD-10-CM | POA: Insufficient documentation

## 2014-01-09 DIAGNOSIS — E119 Type 2 diabetes mellitus without complications: Secondary | ICD-10-CM | POA: Insufficient documentation

## 2014-01-09 DIAGNOSIS — E079 Disorder of thyroid, unspecified: Secondary | ICD-10-CM | POA: Insufficient documentation

## 2014-01-09 DIAGNOSIS — Z9889 Other specified postprocedural states: Secondary | ICD-10-CM | POA: Diagnosis not present

## 2014-01-09 DIAGNOSIS — R109 Unspecified abdominal pain: Secondary | ICD-10-CM | POA: Diagnosis present

## 2014-01-09 LAB — BASIC METABOLIC PANEL
Anion gap: 3 — ABNORMAL LOW (ref 5–15)
BUN: 10 mg/dL (ref 6–23)
CO2: 29 mmol/L (ref 19–32)
Calcium: 8.1 mg/dL — ABNORMAL LOW (ref 8.4–10.5)
Chloride: 107 mEq/L (ref 96–112)
Creatinine, Ser: 0.77 mg/dL (ref 0.50–1.10)
Glucose, Bld: 138 mg/dL — ABNORMAL HIGH (ref 70–99)
Potassium: 3.3 mmol/L — ABNORMAL LOW (ref 3.5–5.1)
SODIUM: 139 mmol/L (ref 135–145)

## 2014-01-09 LAB — URINALYSIS, ROUTINE W REFLEX MICROSCOPIC
BILIRUBIN URINE: NEGATIVE
GLUCOSE, UA: NEGATIVE mg/dL
Ketones, ur: NEGATIVE mg/dL
Leukocytes, UA: NEGATIVE
Nitrite: NEGATIVE
Protein, ur: NEGATIVE mg/dL
UROBILINOGEN UA: 0.2 mg/dL (ref 0.0–1.0)
pH: 7.5 (ref 5.0–8.0)

## 2014-01-09 LAB — CBC WITH DIFFERENTIAL/PLATELET
BASOS ABS: 0 10*3/uL (ref 0.0–0.1)
Basophils Relative: 0 % (ref 0–1)
EOS PCT: 2 % (ref 0–5)
Eosinophils Absolute: 0.2 10*3/uL (ref 0.0–0.7)
HEMATOCRIT: 34.9 % — AB (ref 36.0–46.0)
Hemoglobin: 11.7 g/dL — ABNORMAL LOW (ref 12.0–15.0)
LYMPHS ABS: 4.3 10*3/uL — AB (ref 0.7–4.0)
Lymphocytes Relative: 57 % — ABNORMAL HIGH (ref 12–46)
MCH: 31.6 pg (ref 26.0–34.0)
MCHC: 33.5 g/dL (ref 30.0–36.0)
MCV: 94.3 fL (ref 78.0–100.0)
MONO ABS: 0.3 10*3/uL (ref 0.1–1.0)
Monocytes Relative: 4 % (ref 3–12)
NEUTROS ABS: 2.7 10*3/uL (ref 1.7–7.7)
Neutrophils Relative %: 37 % — ABNORMAL LOW (ref 43–77)
Platelets: 175 10*3/uL (ref 150–400)
RBC: 3.7 MIL/uL — AB (ref 3.87–5.11)
RDW: 13.3 % (ref 11.5–15.5)
WBC: 7.5 10*3/uL (ref 4.0–10.5)

## 2014-01-09 LAB — URINE MICROSCOPIC-ADD ON

## 2014-01-09 LAB — CBG MONITORING, ED: Glucose-Capillary: 91 mg/dL (ref 70–99)

## 2014-01-09 MED ORDER — HYDROCODONE-ACETAMINOPHEN 5-325 MG PO TABS: 1.0000 | ORAL_TABLET | Freq: Four times a day (QID) | ORAL | Status: DC | PRN

## 2014-01-09 MED ORDER — CIPROFLOXACIN HCL 250 MG PO TABS
500.0000 mg | ORAL_TABLET | Freq: Once | ORAL | Status: AC
  Administered 2014-01-09: 500 mg via ORAL
  Filled 2014-01-09: qty 2

## 2014-01-09 MED ORDER — CIPROFLOXACIN HCL 500 MG PO TABS: 500.0000 mg | ORAL_TABLET | Freq: Two times a day (BID) | ORAL | Status: DC

## 2014-01-09 MED ORDER — KETOROLAC TROMETHAMINE 30 MG/ML IJ SOLN
30.0000 mg | Freq: Once | INTRAMUSCULAR | Status: AC
  Administered 2014-01-09: 30 mg via INTRAVENOUS
  Filled 2014-01-09: qty 1

## 2014-01-09 MED ORDER — ONDANSETRON HCL 4 MG/2ML IJ SOLN
4.0000 mg | Freq: Once | INTRAMUSCULAR | Status: AC
  Administered 2014-01-09: 4 mg via INTRAVENOUS
  Filled 2014-01-09: qty 2

## 2014-01-09 MED ORDER — SODIUM CHLORIDE 0.9 % IV BOLUS (SEPSIS)
1000.0000 mL | Freq: Once | INTRAVENOUS | Status: AC
  Administered 2014-01-09: 1000 mL via INTRAVENOUS

## 2014-01-09 NOTE — ED Notes (Signed)
PT c/o right flank pain with urinary retention x1 day. PT also reports diarrhea today and some nausea but no vomiting.

## 2014-01-09 NOTE — Discharge Instructions (Signed)
Follow up with your md as scheduled.   If not improving then follow up in 1-2 weeks

## 2014-01-09 NOTE — ED Provider Notes (Signed)
CSN: [id number]     Arrival date & time 01/09/14  1719 History   First MD Initiated Contact with Patient 01/09/14 1939     Chief Complaint  Patient presents with  . Flank Pain     (Consider location/radiation/quality/duration/timing/severity/associated sxs/prior Treatment) Patient is a 49 y.o. female presenting with flank pain. The history is provided by the patient (pt complains of right flank pain).  Flank Pain This is a new problem. The current episode started yesterday. The problem occurs constantly. The problem has not changed since onset.Pertinent negatives include no chest pain, no abdominal pain and no headaches. Nothing aggravates the symptoms. Nothing relieves the symptoms.    Past Medical History  Diagnosis Date  . DDD (degenerative disc disease)   . COPD (chronic obstructive pulmonary disease)   . Diabetes mellitus   . Hypertension   . Thyroid disease   . Depression   . DDD (degenerative disc disease)   . Degenerative disc disease    Past Surgical History  Procedure Laterality Date  . Abdominal hysterectomy    . Cesarean section     Family History  Problem Relation Age of Onset  . Diabetes Brother   . Hypertension Other   . Diabetes Other   . Thyroid disease Other   . Hyperlipidemia Other    History  Substance Use Topics  . Smoking status: Current Every Day Smoker -- 0.50 packs/day for 29 years    Types: Cigarettes  . Smokeless tobacco: Never Used  . Alcohol Use: No   OB History    Gravida Para Term Preterm AB TAB SAB Ectopic Multiple Living   2 2 2       2      Review of Systems  Constitutional: Negative for appetite change and fatigue.  HENT: Negative for congestion, ear discharge and sinus pressure.   Eyes: Negative for discharge.  Respiratory: Negative for cough.   Cardiovascular: Negative for chest pain.  Gastrointestinal: Negative for abdominal pain and diarrhea.  Genitourinary: Positive for flank pain. Negative for frequency and hematuria.   Musculoskeletal: Negative for back pain.  Skin: Negative for rash.  Neurological: Negative for seizures and headaches.  Psychiatric/Behavioral: Negative for hallucinations.      Allergies  Cephalexin; Flexeril; Morphine and related; Ultram; Nsaids; Sulfa antibiotics; and Codeine  Home Medications   Prior to Admission medications   Medication Sig Start Date End Date Taking? Authorizing Provider  acetaminophen (TYLENOL) 500 MG tablet Take 2,000 mg by mouth daily as needed. For fever and for pain. Patient states that she sometimes takes 6 tablets in one dose for pain.    Historical Provider, MD  albuterol (PROVENTIL HFA;VENTOLIN HFA) 108 (90 BASE) MCG/ACT inhaler Inhale 2 puffs into the lungs every 4 (four) hours as needed for wheezing or shortness of breath. 04/17/11   Charlena Cross, MD  ALPRAZolam Duanne Moron) 0.25 MG tablet Take 1 tablet (0.25 mg total) by mouth at bedtime as needed for anxiety. 02/24/13   Elisha Headland, NP  aspirin EC 81 MG tablet Take 81 mg by mouth daily.    Historical Provider, MD  atenolol-chlorthalidone (TENORETIC) 100-25 MG per tablet Take 1 tablet by mouth daily.    Historical Provider, MD  benztropine (COGENTIN) 1 MG tablet Take 1 mg by mouth at bedtime.    Historical Provider, MD  ciprofloxacin (CIPRO) 500 MG tablet Take 1 tablet (500 mg total) by mouth 2 (two) times daily. One po bid x 7 days 01/09/14   Maudry Diego, MD  fish oil-omega-3 fatty acids 1000 MG capsule Take 1 g by mouth 2 (two) times daily.     Historical Provider, MD  HYDROcodone-acetaminophen (NORCO/VICODIN) 5-325 MG per tablet Take 1 tablet by mouth every 6 (six) hours as needed for moderate pain. 01/09/14   Maudry Diego, MD  ipratropium (ATROVENT) 0.02 % nebulizer solution Take 500 mcg by nebulization 4 (four) times daily.      Historical Provider, MD  levothyroxine (SYNTHROID, LEVOTHROID) 25 MCG tablet Take 25 mcg by mouth every morning.     Historical Provider, MD  metFORMIN (GLUCOPHAGE-XR) 500  MG 24 hr tablet Take 500 mg by mouth 2 (two) times daily.    Historical Provider, MD  vitamin C (ASCORBIC ACID) 500 MG tablet Take 1,500 mg by mouth daily.     Historical Provider, MD   BP 133/82 mmHg  Pulse 71  Temp(Src) 97.5 F (36.4 C) (Oral)  Resp 18  Ht 4\' 11"  (1.499 m)  Wt 130 lb (58.968 kg)  BMI 26.24 kg/m2  SpO2 100% Physical Exam  Constitutional: She is oriented to person, place, and time. She appears well-developed.  HENT:  Head: Normocephalic.  Eyes: Conjunctivae and EOM are normal. No scleral icterus.  Neck: Neck supple. No thyromegaly present.  Cardiovascular: Normal rate and regular rhythm.  Exam reveals no gallop and no friction rub.   No murmur heard. Pulmonary/Chest: No stridor. She has no wheezes. She has no rales. She exhibits no tenderness.  Abdominal: She exhibits no distension. There is no tenderness. There is no rebound.  Musculoskeletal: Normal range of motion. She exhibits no edema.  Tender right flank  Lymphadenopathy:    She has no cervical adenopathy.  Neurological: She is oriented to person, place, and time. She exhibits normal muscle tone. Coordination normal.  Skin: No rash noted. No erythema.  Psychiatric: She has a normal mood and affect. Her behavior is normal.    ED Course  Procedures (including critical care time) Labs Review Labs Reviewed  URINALYSIS, ROUTINE W REFLEX MICROSCOPIC - Abnormal; Notable for the following:    Specific Gravity, Urine <1.005 (*)    Hgb urine dipstick LARGE (*)    All other components within normal limits  CBC WITH DIFFERENTIAL - Abnormal; Notable for the following:    RBC 3.70 (*)    Hemoglobin 11.7 (*)    HCT 34.9 (*)    Neutrophils Relative % 37 (*)    Lymphocytes Relative 57 (*)    Lymphs Abs 4.3 (*)    All other components within normal limits  BASIC METABOLIC PANEL - Abnormal; Notable for the following:    Potassium 3.3 (*)    Glucose, Bld 138 (*)    Calcium 8.1 (*)    Anion gap 3 (*)    All other  components within normal limits  URINE CULTURE  URINE MICROSCOPIC-ADD ON  CBG MONITORING, ED    Imaging Review Ct Renal Stone Study  01/09/2014   CLINICAL DATA:  RIGHT flank pain, nausea, and diarrhea today, urinary retention, history COPD, diabetes, hypertension, smoker  EXAM: CT ABDOMEN AND PELVIS WITHOUT CONTRAST  TECHNIQUE: Multidetector CT imaging of the abdomen and pelvis was performed following the standard protocol without IV contrast. Sagittal and coronal MPR images reconstructed from axial data set.  COMPARISON:  04/07/2010  FINDINGS: Linear subsegmental atelectasis at base of RIGHT middle lobe.  No urinary tract calcification, hydronephrosis, or ureteral dilatation.  Bladder and ureters unremarkable.  Normal appendix.  Within limits of a nonenhanced exam  no focal abnormalities of the liver, spleen, pancreas, kidneys, or adrenal glands.  Scattered colonic diverticula without diverticulitis.  Stomach and bowel loops otherwise unremarkable.  Normal sized inguinal nodes bilaterally.  No intra-abdominal or intrapelvic adenopathy.  Uterus surgically absent with nonvisualization of ovaries.  No mass, free fluid, free air, hernia, or acute osseous findings.  IMPRESSION: Colonic diverticulosis without evidence of diverticulitis.  No acute intra-abdominal or intrapelvic process identified.   Electronically Signed   By: Lavonia Dana M.D.   On: 01/09/2014 21:24     EKG Interpretation None      MDM   Final diagnoses:  Pain  Hematuria    Hematuria,    Culture urine,  tx with cipro and follow up with pcp    Maudry Diego, MD 01/09/14 2155

## 2014-01-11 LAB — URINE CULTURE: Colony Count: 50000

## 2014-04-18 ENCOUNTER — Other Ambulatory Visit: Payer: Self-pay | Admitting: Neurosurgery

## 2014-04-21 ENCOUNTER — Encounter (HOSPITAL_COMMUNITY)
Admission: RE | Admit: 2014-04-21 | Discharge: 2014-04-21 | Disposition: A | Discharge status: Home / Self Care | Payer: Medicaid Other | Source: Ambulatory Visit | Attending: Neurosurgery | Admitting: Neurosurgery

## 2014-04-21 DIAGNOSIS — Z01810 Encounter for preprocedural cardiovascular examination: Secondary | ICD-10-CM | POA: Insufficient documentation

## 2014-04-21 DIAGNOSIS — Z01812 Encounter for preprocedural laboratory examination: Secondary | ICD-10-CM | POA: Diagnosis present

## 2014-04-21 LAB — CBC
HEMATOCRIT: 38.9 % (ref 36.0–46.0)
Hemoglobin: 12.6 g/dL (ref 12.0–15.0)
MCH: 30.4 pg (ref 26.0–34.0)
MCHC: 32.4 g/dL (ref 30.0–36.0)
MCV: 94 fL (ref 78.0–100.0)
Platelets: 253 10*3/uL (ref 150–400)
RBC: 4.14 MIL/uL (ref 3.87–5.11)
RDW: 13.1 % (ref 11.5–15.5)
WBC: 8.2 10*3/uL (ref 4.0–10.5)

## 2014-04-21 LAB — BASIC METABOLIC PANEL
Anion gap: 6 (ref 5–15)
BUN: 10 mg/dL (ref 6–23)
CHLORIDE: 103 mmol/L (ref 96–112)
CO2: 31 mmol/L (ref 19–32)
Calcium: 8.7 mg/dL (ref 8.4–10.5)
Creatinine, Ser: 0.77 mg/dL (ref 0.50–1.10)
GFR calc Af Amer: >90 mL/min (ref >90)
Glucose, Bld: 115 mg/dL — ABNORMAL HIGH (ref 70–99)
Potassium: 4 mmol/L (ref 3.5–5.1)
Sodium: 140 mmol/L (ref 135–145)

## 2014-04-21 LAB — SURGICAL PCR SCREEN
MRSA, PCR: NEGATIVE
Staphylococcus aureus: POSITIVE — AB

## 2014-04-21 LAB — TYPE AND SCREEN
ABO/RH(D): O POS
Antibody Screen: NEGATIVE

## 2014-04-21 LAB — ABO/RH: ABO/RH(D): O POS

## 2014-04-21 NOTE — Pre-Procedure Instructions (Signed)
[name]  04/21/2014   Your procedure is scheduled on:  April 25, 2014  Report to [facility] Admitting at 6 AM.  Call this number if you have problems the morning of surgery: [phone]   Remember:   Do not eat food or drink liquids after midnight.   Take these medicines the morning of surgery with A SIP OF WATER: albuterol (PROVENTIL HFA;VENTOLIN HFA) bring with you if needed , atenolol-chlorthalidone (TENORETIC) , ciprofloxacin (CIPRO), levothyroxine (SYNTHROID, LEVOTHROID), ipratropium (ATROVENT) 0.02 % nebulizer, pain pill if needed    Do not wear jewelry, make-up or nail polish.  Do not wear lotions, powders, or perfumes. You may wear deodorant.  Do not shave 48 hours prior to surgery. Men may shave face and neck.  Do not bring valuables to the hospital.  [facility] is not responsible                  for any belongings or valuables.               Contacts, dentures or bridgework may not be worn into surgery.  Leave suitcase in the car. After surgery it may be brought to your room.  For patients admitted to the hospital, discharge time is determined by your                treatment team.               Patients discharged the day of surgery will not be allowed to drive  home.  Name and phone number of your driver: family/friend  Special Instructions: "Preparing for Surgery"   Please read over the following fact sheets that you were given: Pain Booklet, Coughing and Deep Breathing, Blood Transfusion Information and Surgical Site Infection Prevention

## 2014-04-21 NOTE — Progress Notes (Signed)
Left voicemail on mobile for pt to pick up mupiricin at [facility], no answer at home number.

## 2014-04-24 MED ORDER — VANCOMYCIN HCL IN DEXTROSE 1-5 GM/200ML-% IV SOLN
1000.0000 mg | INTRAVENOUS | Status: AC
Start: 2014-04-25 — End: 2014-04-25
  Administered 2014-04-25: 1000 mg via INTRAVENOUS
  Filled 2014-04-24: qty 200

## 2014-04-24 NOTE — H&P (Signed)
[name] is an 49 y.o. female.   Chief Complaint: lumbar pain HPI: patient complaining of lumbar pain which is getting worse with radiaion to both lega associated with tinglining sensation. She was seen in the ER . Medications are of no help  Past Medical History  Diagnosis Date  . DDD (degenerative disc disease)   . COPD (chronic obstructive pulmonary disease)   . Diabetes mellitus   . Hypertension   . Thyroid disease   . Depression   . DDD (degenerative disc disease)   . Degenerative disc disease     Past Surgical History  Procedure Laterality Date  . Abdominal hysterectomy    . Cesarean section      Family History  Problem Relation Age of Onset  . Diabetes Brother   . Hypertension Other   . Diabetes Other   . Thyroid disease Other   . Hyperlipidemia Other    Social History:  reports that she has been smoking Cigarettes.  She has a 14.5 pack-year smoking history. She has never used smokeless tobacco. She reports that she does not drink alcohol or use illicit drugs.  Allergies:  Allergies  Allergen Reactions  . Cephalexin Anaphylaxis and Hives  . Flexeril [Cyclobenzaprine Hcl] Anaphylaxis  . Morphine And Related Anaphylaxis  . Ultram [Tramadol Hcl] Anaphylaxis  . Nsaids Hives, Swelling and Other (See Comments)    Skin feels hot "burning"  . Sulfa Antibiotics Hives and Itching  . Codeine Itching and Rash    Skin feels hot, "burning"    No prescriptions prior to admission    No results found for this or any previous visit (from the past 48 hour(s)). No results found.  Review of Systems  Constitutional: Negative.   HENT: Negative.   Eyes: Negative.   Respiratory: Positive for cough.   Gastrointestinal: Negative.   Genitourinary: Positive for urgency.  Musculoskeletal: Positive for back pain.  Skin: Negative.   Neurological: Positive for focal weakness.  Endo/Heme/Allergies: Negative.   Psychiatric/Behavioral: Negative.     There were no  vitals taken for this visit. Physical Examhent, nl. Neck, nl. Cv, nl. Lungs, ronchii bilaterally. Abdomen,soft. Extremities, nl. NEURO weakness of DF both feet. SLR positive at 60 degrees.radiological studies shows l5-s1 spondylolisthesis with facer hyperthrophy.   Assessment/Planpatient wants to go ahead with surgery which is l5s1 fusion with cages and pedicle screws. She is aware of risks and benefits See above  Dominigue Gellner M 04/24/2014, 2:20 PM

## 2014-04-25 ENCOUNTER — Encounter (HOSPITAL_COMMUNITY)
Admission: RE | Disposition: A | Discharge status: Home / Self Care | Payer: Medicaid Other | Source: Ambulatory Visit | Attending: Neurosurgery

## 2014-04-25 ENCOUNTER — Inpatient Hospital Stay (HOSPITAL_COMMUNITY): Payer: Medicaid Other

## 2014-04-25 ENCOUNTER — Inpatient Hospital Stay (HOSPITAL_COMMUNITY): Payer: Medicaid Other | Admitting: Anesthesiology

## 2014-04-25 ENCOUNTER — Inpatient Hospital Stay (HOSPITAL_COMMUNITY)
Admission: RE | Admit: 2014-04-25 | Discharge: 2014-04-28 | DRG: 460 | Disposition: A | Discharge status: Home / Self Care | Payer: Medicaid Other | Source: Ambulatory Visit | Attending: Neurosurgery | Admitting: Neurosurgery

## 2014-04-25 ENCOUNTER — Encounter (HOSPITAL_COMMUNITY): Payer: Self-pay

## 2014-04-25 DIAGNOSIS — E119 Type 2 diabetes mellitus without complications: Secondary | ICD-10-CM | POA: Diagnosis present

## 2014-04-25 DIAGNOSIS — F17210 Nicotine dependence, cigarettes, uncomplicated: Secondary | ICD-10-CM | POA: Diagnosis present

## 2014-04-25 DIAGNOSIS — M545 Low back pain: Secondary | ICD-10-CM | POA: Diagnosis present

## 2014-04-25 DIAGNOSIS — M4316 Spondylolisthesis, lumbar region: Secondary | ICD-10-CM | POA: Diagnosis present

## 2014-04-25 DIAGNOSIS — M4317 Spondylolisthesis, lumbosacral region: Principal | ICD-10-CM | POA: Diagnosis present

## 2014-04-25 DIAGNOSIS — Z833 Family history of diabetes mellitus: Secondary | ICD-10-CM | POA: Diagnosis not present

## 2014-04-25 DIAGNOSIS — K5900 Constipation, unspecified: Secondary | ICD-10-CM | POA: Diagnosis not present

## 2014-04-25 DIAGNOSIS — F329 Major depressive disorder, single episode, unspecified: Secondary | ICD-10-CM | POA: Diagnosis present

## 2014-04-25 DIAGNOSIS — I10 Essential (primary) hypertension: Secondary | ICD-10-CM | POA: Diagnosis present

## 2014-04-25 DIAGNOSIS — M5416 Radiculopathy, lumbar region: Secondary | ICD-10-CM | POA: Diagnosis present

## 2014-04-25 DIAGNOSIS — J449 Chronic obstructive pulmonary disease, unspecified: Secondary | ICD-10-CM | POA: Diagnosis present

## 2014-04-25 DIAGNOSIS — Z419 Encounter for procedure for purposes other than remedying health state, unspecified: Secondary | ICD-10-CM

## 2014-04-25 HISTORY — DX: Low back pain, unspecified: M54.50

## 2014-04-25 HISTORY — DX: Low back pain: M54.5

## 2014-04-25 HISTORY — DX: Migraine, unspecified, not intractable, without status migrainosus: G43.909

## 2014-04-25 HISTORY — DX: Pure hypercholesterolemia, unspecified: E78.00

## 2014-04-25 HISTORY — DX: Type 2 diabetes mellitus without complications: E11.9

## 2014-04-25 HISTORY — PX: POSTERIOR LUMBAR FUSION: SHX6036

## 2014-04-25 HISTORY — DX: Personal history of other diseases of the digestive system: Z87.19

## 2014-04-25 HISTORY — DX: Gastro-esophageal reflux disease without esophagitis: K21.9

## 2014-04-25 HISTORY — DX: Emphysema, unspecified: J43.9

## 2014-04-25 HISTORY — DX: Pneumonia, unspecified organism: J18.9

## 2014-04-25 HISTORY — DX: Anxiety disorder, unspecified: F41.9

## 2014-04-25 HISTORY — DX: Other chronic pain: G89.29

## 2014-04-25 HISTORY — DX: Malignant neoplasm of unspecified ovary: C56.9

## 2014-04-25 LAB — GLUCOSE, CAPILLARY
GLUCOSE-CAPILLARY: 151 mg/dL — AB (ref 70–99)
Glucose-Capillary: 172 mg/dL — ABNORMAL HIGH (ref 70–99)
Glucose-Capillary: 117 mg/dL — ABNORMAL HIGH (ref 70–99)

## 2014-04-25 SURGERY — POSTERIOR LUMBAR FUSION 1 LEVEL
Anesthesia: General | Site: Spine Lumbar

## 2014-04-25 MED ORDER — PHENOL 1.4 % MT LIQD
1.0000 | OROMUCOSAL | Status: DC | PRN
Start: 2014-04-25 — End: 2014-04-28
  Filled 2014-04-25: qty 177

## 2014-04-25 MED ORDER — ACETAMINOPHEN 650 MG RE SUPP: 650.0000 mg | RECTAL | Status: DC | PRN

## 2014-04-25 MED ORDER — ATENOLOL 100 MG PO TABS
100.0000 mg | ORAL_TABLET | Freq: Once | ORAL | Status: AC
  Administered 2014-04-25: 100 mg via ORAL
  Filled 2014-04-25: qty 1

## 2014-04-25 MED ORDER — HYDROMORPHONE HCL 1 MG/ML IJ SOLN
INTRAMUSCULAR | Status: AC
  Filled 2014-04-25: qty 1

## 2014-04-25 MED ORDER — OXYCODONE-ACETAMINOPHEN 5-325 MG PO TABS
1.0000 | ORAL_TABLET | ORAL | Status: DC | PRN
  Administered 2014-04-26 – 2014-04-28 (×12): 2 via ORAL
  Filled 2014-04-25 (×12): qty 2

## 2014-04-25 MED ORDER — DIAZEPAM 5 MG PO TABS
5.0000 mg | ORAL_TABLET | Freq: Four times a day (QID) | ORAL | Status: DC | PRN
  Administered 2014-04-25 – 2014-04-28 (×7): 5 mg via ORAL
  Filled 2014-04-25 (×6): qty 1

## 2014-04-25 MED ORDER — VANCOMYCIN HCL 1000 MG IV SOLR
INTRAVENOUS | Status: DC | PRN
  Administered 2014-04-25 (×2): 1000 mg via TOPICAL

## 2014-04-25 MED ORDER — LEVOTHYROXINE SODIUM 25 MCG PO TABS
25.0000 ug | ORAL_TABLET | Freq: Every day | ORAL | Status: DC
  Administered 2014-04-26 – 2014-04-28 (×3): 25 ug via ORAL
  Filled 2014-04-25 (×3): qty 1

## 2014-04-25 MED ORDER — PROPOFOL 10 MG/ML IV BOLUS
INTRAVENOUS | Status: AC
  Filled 2014-04-25: qty 20

## 2014-04-25 MED ORDER — LIDOCAINE HCL (CARDIAC) 20 MG/ML IV SOLN
INTRAVENOUS | Status: AC
  Filled 2014-04-25: qty 5

## 2014-04-25 MED ORDER — PHENYLEPHRINE HCL 10 MG/ML IJ SOLN
INTRAMUSCULAR | Status: DC | PRN
  Administered 2014-04-25: 80 ug via INTRAVENOUS

## 2014-04-25 MED ORDER — PNEUMOCOCCAL VAC POLYVALENT 25 MCG/0.5ML IJ INJ
0.5000 mL | INJECTION | INTRAMUSCULAR | Status: AC
  Administered 2014-04-26: 0.5 mL via INTRAMUSCULAR
  Filled 2014-04-25: qty 0.5

## 2014-04-25 MED ORDER — GLYCOPYRROLATE 0.2 MG/ML IJ SOLN
INTRAMUSCULAR | Status: AC
  Filled 2014-04-25: qty 2

## 2014-04-25 MED ORDER — MUPIROCIN 2 % EX OINT
TOPICAL_OINTMENT | CUTANEOUS | Status: AC
  Filled 2014-04-25: qty 22

## 2014-04-25 MED ORDER — MIDAZOLAM HCL 5 MG/5ML IJ SOLN
INTRAMUSCULAR | Status: DC | PRN
  Administered 2014-04-25: 2 mg via INTRAVENOUS

## 2014-04-25 MED ORDER — ACETAMINOPHEN 325 MG PO TABS: 650.0000 mg | ORAL_TABLET | ORAL | Status: DC | PRN

## 2014-04-25 MED ORDER — EPHEDRINE SULFATE 50 MG/ML IJ SOLN
INTRAMUSCULAR | Status: DC | PRN
  Administered 2014-04-25 (×3): 5 mg via INTRAVENOUS
  Administered 2014-04-25: 10 mg via INTRAVENOUS
  Administered 2014-04-25: 5 mg via INTRAVENOUS

## 2014-04-25 MED ORDER — SUCCINYLCHOLINE CHLORIDE 20 MG/ML IJ SOLN
INTRAMUSCULAR | Status: AC
  Filled 2014-04-25: qty 1

## 2014-04-25 MED ORDER — OXYCODONE HCL 5 MG/5ML PO SOLN: 5.0000 mg | Freq: Once | ORAL | Status: DC | PRN

## 2014-04-25 MED ORDER — SODIUM CHLORIDE 0.9 % IJ SOLN
INTRAMUSCULAR | Status: AC
  Filled 2014-04-25: qty 10

## 2014-04-25 MED ORDER — MUPIROCIN 2 % EX OINT
1.0000 "application " | TOPICAL_OINTMENT | Freq: Once | CUTANEOUS | Status: AC
  Administered 2014-04-25: 1 via TOPICAL

## 2014-04-25 MED ORDER — HYDROMORPHONE HCL 1 MG/ML IJ SOLN
0.2500 mg | INTRAMUSCULAR | Status: DC | PRN
  Administered 2014-04-25 (×2): 0.5 mg via INTRAVENOUS

## 2014-04-25 MED ORDER — PRAVASTATIN SODIUM 20 MG PO TABS
20.0000 mg | ORAL_TABLET | Freq: Every day | ORAL | Status: DC
  Administered 2014-04-25 – 2014-04-27 (×3): 20 mg via ORAL
  Filled 2014-04-25 (×3): qty 1

## 2014-04-25 MED ORDER — IPRATROPIUM-ALBUTEROL 0.5-2.5 (3) MG/3ML IN SOLN
3.0000 mL | Freq: Four times a day (QID) | RESPIRATORY_TRACT | Status: DC
  Administered 2014-04-25 – 2014-04-27 (×6): 3 mL via RESPIRATORY_TRACT
  Filled 2014-04-25 (×7): qty 3

## 2014-04-25 MED ORDER — EPHEDRINE SULFATE 50 MG/ML IJ SOLN
INTRAMUSCULAR | Status: AC
  Filled 2014-04-25: qty 1

## 2014-04-25 MED ORDER — ATENOLOL-CHLORTHALIDONE 100-25 MG PO TABS: 1.0000 | ORAL_TABLET | Freq: Every day | ORAL | Status: DC

## 2014-04-25 MED ORDER — 0.9 % SODIUM CHLORIDE (POUR BTL) OPTIME
TOPICAL | Status: DC | PRN
  Administered 2014-04-25: 1000 mL

## 2014-04-25 MED ORDER — PHENYLEPHRINE 40 MCG/ML (10ML) SYRINGE FOR IV PUSH (FOR BLOOD PRESSURE SUPPORT)
PREFILLED_SYRINGE | INTRAVENOUS | Status: AC
  Filled 2014-04-25: qty 10

## 2014-04-25 MED ORDER — ESCITALOPRAM OXALATE 20 MG PO TABS
20.0000 mg | ORAL_TABLET | Freq: Every day | ORAL | Status: DC
  Administered 2014-04-25 – 2014-04-28 (×4): 20 mg via ORAL
  Filled 2014-04-25 (×2): qty 2
  Filled 2014-04-25 (×4): qty 1
  Filled 2014-04-25: qty 2

## 2014-04-25 MED ORDER — LISINOPRIL 2.5 MG PO TABS
2.5000 mg | ORAL_TABLET | Freq: Every day | ORAL | Status: DC
  Filled 2014-04-25 (×4): qty 1

## 2014-04-25 MED ORDER — MIDAZOLAM HCL 2 MG/2ML IJ SOLN
INTRAMUSCULAR | Status: AC
  Filled 2014-04-25: qty 2

## 2014-04-25 MED ORDER — GLYCOPYRROLATE 0.2 MG/ML IJ SOLN
INTRAMUSCULAR | Status: DC | PRN
  Administered 2014-04-25: 0.4 mg via INTRAVENOUS

## 2014-04-25 MED ORDER — ASPIRIN EC 81 MG PO TBEC
81.0000 mg | DELAYED_RELEASE_TABLET | Freq: Every day | ORAL | Status: DC
  Administered 2014-04-25 – 2014-04-28 (×4): 81 mg via ORAL
  Filled 2014-04-25 (×4): qty 1

## 2014-04-25 MED ORDER — THROMBIN 5000 UNITS EX SOLR
CUTANEOUS | Status: DC | PRN
  Administered 2014-04-25 (×4): 5000 [IU] via TOPICAL

## 2014-04-25 MED ORDER — NEOSTIGMINE METHYLSULFATE 10 MG/10ML IV SOLN
INTRAVENOUS | Status: AC
Start: 2014-04-25 — End: 2014-04-25
  Filled 2014-04-25: qty 1

## 2014-04-25 MED ORDER — SODIUM CHLORIDE 0.9 % IV SOLN
INTRAVENOUS | Status: DC
  Administered 2014-04-25 – 2014-04-26 (×2): via INTRAVENOUS

## 2014-04-25 MED ORDER — VANCOMYCIN HCL 10 G IV SOLR
1250.0000 mg | INTRAVENOUS | Status: DC
  Administered 2014-04-26 – 2014-04-27 (×3): 1250 mg via INTRAVENOUS
  Filled 2014-04-25 (×5): qty 1250

## 2014-04-25 MED ORDER — VANCOMYCIN HCL 1000 MG IV SOLR
INTRAVENOUS | Status: AC
  Filled 2014-04-25: qty 1000

## 2014-04-25 MED ORDER — LACTATED RINGERS IV SOLN
INTRAVENOUS | Status: DC
  Administered 2014-04-25 (×2): via INTRAVENOUS

## 2014-04-25 MED ORDER — FENTANYL CITRATE (PF) 100 MCG/2ML IJ SOLN
INTRAMUSCULAR | Status: DC | PRN
  Administered 2014-04-25 (×3): 50 ug via INTRAVENOUS
  Administered 2014-04-25: 100 ug via INTRAVENOUS

## 2014-04-25 MED ORDER — MENTHOL 3 MG MT LOZG
1.0000 | LOZENGE | OROMUCOSAL | Status: DC | PRN
  Filled 2014-04-25: qty 9

## 2014-04-25 MED ORDER — SODIUM CHLORIDE 0.9 % IJ SOLN
3.0000 mL | Freq: Two times a day (BID) | INTRAMUSCULAR | Status: DC
  Administered 2014-04-26 – 2014-04-27 (×3): 3 mL via INTRAVENOUS

## 2014-04-25 MED ORDER — CHLORTHALIDONE 25 MG PO TABS
25.0000 mg | ORAL_TABLET | Freq: Every day | ORAL | Status: DC
  Administered 2014-04-26 – 2014-04-28 (×3): 25 mg via ORAL
  Filled 2014-04-25 (×3): qty 1

## 2014-04-25 MED ORDER — BUPIVACAINE LIPOSOME 1.3 % IJ SUSP
20.0000 mL | INTRAMUSCULAR | Status: AC
  Filled 2014-04-25: qty 20

## 2014-04-25 MED ORDER — ALPRAZOLAM 0.25 MG PO TABS: 0.2500 mg | ORAL_TABLET | Freq: Every evening | ORAL | Status: DC | PRN

## 2014-04-25 MED ORDER — LIDOCAINE HCL (CARDIAC) 20 MG/ML IV SOLN
INTRAVENOUS | Status: DC | PRN
  Administered 2014-04-25: 60 mg via INTRAVENOUS

## 2014-04-25 MED ORDER — ONDANSETRON HCL 4 MG/2ML IJ SOLN
INTRAMUSCULAR | Status: AC
  Filled 2014-04-25: qty 2

## 2014-04-25 MED ORDER — ATENOLOL 100 MG PO TABS
100.0000 mg | ORAL_TABLET | Freq: Every day | ORAL | Status: DC
  Filled 2014-04-25 (×4): qty 1

## 2014-04-25 MED ORDER — BUPIVACAINE LIPOSOME 1.3 % IJ SUSP
INTRAMUSCULAR | Status: DC | PRN
  Administered 2014-04-25: 20 mL

## 2014-04-25 MED ORDER — METFORMIN HCL ER 500 MG PO TB24
500.0000 mg | ORAL_TABLET | Freq: Two times a day (BID) | ORAL | Status: DC
  Administered 2014-04-25 – 2014-04-28 (×7): 500 mg via ORAL
  Filled 2014-04-25 (×7): qty 1

## 2014-04-25 MED ORDER — ROCURONIUM BROMIDE 100 MG/10ML IV SOLN
INTRAVENOUS | Status: DC | PRN
  Administered 2014-04-25: 10 mg via INTRAVENOUS
  Administered 2014-04-25: 30 mg via INTRAVENOUS

## 2014-04-25 MED ORDER — PROMETHAZINE HCL 25 MG/ML IJ SOLN: 6.2500 mg | INTRAMUSCULAR | Status: DC | PRN

## 2014-04-25 MED ORDER — ALBUTEROL SULFATE (2.5 MG/3ML) 0.083% IN NEBU: 2.0000 mL | INHALATION_SOLUTION | RESPIRATORY_TRACT | Status: DC | PRN

## 2014-04-25 MED ORDER — FENTANYL CITRATE (PF) 250 MCG/5ML IJ SOLN
INTRAMUSCULAR | Status: AC
  Filled 2014-04-25: qty 5

## 2014-04-25 MED ORDER — GABAPENTIN 300 MG PO CAPS
300.0000 mg | ORAL_CAPSULE | Freq: Three times a day (TID) | ORAL | Status: DC
  Administered 2014-04-25 – 2014-04-28 (×9): 300 mg via ORAL
  Filled 2014-04-25 (×9): qty 1

## 2014-04-25 MED ORDER — DIAZEPAM 5 MG PO TABS
ORAL_TABLET | ORAL | Status: AC
  Filled 2014-04-25: qty 1

## 2014-04-25 MED ORDER — ONDANSETRON HCL 4 MG/2ML IJ SOLN: 4.0000 mg | INTRAMUSCULAR | Status: DC | PRN

## 2014-04-25 MED ORDER — PROPOFOL 10 MG/ML IV BOLUS
INTRAVENOUS | Status: DC | PRN
  Administered 2014-04-25 (×2): 100 mg via INTRAVENOUS
  Administered 2014-04-25: 50 mg via INTRAVENOUS

## 2014-04-25 MED ORDER — BENZTROPINE MESYLATE 1 MG PO TABS
1.0000 mg | ORAL_TABLET | Freq: Every day | ORAL | Status: DC
  Administered 2014-04-25 – 2014-04-27 (×3): 1 mg via ORAL
  Filled 2014-04-25 (×2): qty 2
  Filled 2014-04-25: qty 1
  Filled 2014-04-25: qty 2
  Filled 2014-04-25 (×3): qty 1

## 2014-04-25 MED ORDER — HEMOSTATIC AGENTS (NO CHARGE) OPTIME
TOPICAL | Status: DC | PRN
  Administered 2014-04-25: 1 via TOPICAL

## 2014-04-25 MED ORDER — ONDANSETRON HCL 4 MG/2ML IJ SOLN
INTRAMUSCULAR | Status: DC | PRN
  Administered 2014-04-25: 4 mg via INTRAVENOUS

## 2014-04-25 MED ORDER — OXYCODONE HCL 5 MG PO TABS: 5.0000 mg | ORAL_TABLET | Freq: Once | ORAL | Status: DC | PRN

## 2014-04-25 MED ORDER — SODIUM CHLORIDE 0.9 % IJ SOLN: 3.0000 mL | INTRAMUSCULAR | Status: DC | PRN

## 2014-04-25 MED ORDER — ROCURONIUM BROMIDE 50 MG/5ML IV SOLN
INTRAVENOUS | Status: AC
  Filled 2014-04-25: qty 1

## 2014-04-25 MED ORDER — HYDROMORPHONE HCL 1 MG/ML IJ SOLN
0.5000 mg | INTRAMUSCULAR | Status: DC | PRN
  Administered 2014-04-25 – 2014-04-26 (×6): 1 mg via INTRAVENOUS
  Filled 2014-04-25 (×6): qty 1

## 2014-04-25 MED ORDER — SODIUM CHLORIDE 0.9 % IV SOLN: 250.0000 mL | INTRAVENOUS | Status: DC

## 2014-04-25 MED ORDER — NEOSTIGMINE METHYLSULFATE 10 MG/10ML IV SOLN
INTRAVENOUS | Status: DC | PRN
  Administered 2014-04-25: 3 mg via INTRAVENOUS

## 2014-04-25 MED FILL — Sodium Chloride IV Soln 0.9%: INTRAVENOUS | Qty: 1000 | Status: AC

## 2014-04-25 MED FILL — Heparin Sodium (Porcine) Inj 1000 Unit/ML: INTRAMUSCULAR | Qty: 30 | Status: AC

## 2014-04-25 SURGICAL SUPPLY — 70 items
APL SKNCLS STERI-STRIP NONHPOA (GAUZE/BANDAGES/DRESSINGS) ×1
BENZOIN TINCTURE PRP APPL 2/3 (GAUZE/BANDAGES/DRESSINGS) ×2 IMPLANT
BLADE CLIPPER SURG (BLADE) IMPLANT
BUR ACORN 6.0 (BURR) ×2 IMPLANT
BUR MATCHSTICK NEURO 3.0 LAGG (BURR) ×2 IMPLANT
CANISTER SUCT 3000ML PPV (MISCELLANEOUS) ×2 IMPLANT
CAP LOCKING THREADED (Cap) ×4 IMPLANT
CONT SPEC 4OZ CLIKSEAL STRL BL (MISCELLANEOUS) ×3 IMPLANT
COVER BACK TABLE 60X90IN (DRAPES) ×2 IMPLANT
CROSSLINK SPINAL FUSION (Cage) ×1 IMPLANT
DRAPE C-ARM 42X72 X-RAY (DRAPES) ×4 IMPLANT
DRAPE LAPAROTOMY 100X72X124 (DRAPES) ×2 IMPLANT
DRAPE POUCH INSTRU U-SHP 10X18 (DRAPES) ×2 IMPLANT
DRSG OPSITE POSTOP 4X6 (GAUZE/BANDAGES/DRESSINGS) ×1 IMPLANT
DRSG PAD ABDOMINAL 8X10 ST (GAUZE/BANDAGES/DRESSINGS) IMPLANT
DURAPREP 26ML APPLICATOR (WOUND CARE) ×2 IMPLANT
ELECT REM PT RETURN 9FT ADLT (ELECTROSURGICAL) ×2
ELECTRODE REM PT RTRN 9FT ADLT (ELECTROSURGICAL) ×1 IMPLANT
EVACUATOR 1/8 PVC DRAIN (DRAIN) ×1 IMPLANT
GAUZE SPONGE 4X4 12PLY STRL (GAUZE/BANDAGES/DRESSINGS) ×2 IMPLANT
GAUZE SPONGE 4X4 16PLY XRAY LF (GAUZE/BANDAGES/DRESSINGS) ×2 IMPLANT
GLOVE BIO SURGEON STRL SZ7 (GLOVE) ×2 IMPLANT
GLOVE BIOGEL M 8.0 STRL (GLOVE) ×3 IMPLANT
GLOVE BIOGEL PI IND STRL 8 (GLOVE) IMPLANT
GLOVE BIOGEL PI INDICATOR 8 (GLOVE) ×2
GLOVE ECLIPSE 7.5 STRL STRAW (GLOVE) ×3 IMPLANT
GLOVE ECLIPSE 9.0 STRL (GLOVE) ×1 IMPLANT
GLOVE EXAM NITRILE LRG STRL (GLOVE) IMPLANT
GLOVE EXAM NITRILE MD LF STRL (GLOVE) IMPLANT
GLOVE EXAM NITRILE XL STR (GLOVE) IMPLANT
GLOVE EXAM NITRILE XS STR PU (GLOVE) IMPLANT
GOWN STRL REUS W/ TWL LRG LVL3 (GOWN DISPOSABLE) ×1 IMPLANT
GOWN STRL REUS W/ TWL XL LVL3 (GOWN DISPOSABLE) IMPLANT
GOWN STRL REUS W/TWL 2XL LVL3 (GOWN DISPOSABLE) ×1 IMPLANT
GOWN STRL REUS W/TWL LRG LVL3 (GOWN DISPOSABLE) ×2
GOWN STRL REUS W/TWL XL LVL3 (GOWN DISPOSABLE) ×4
KIT BASIN OR (CUSTOM PROCEDURE TRAY) ×2 IMPLANT
KIT INFUSE MEDIUM (Orthopedic Implant) ×1 IMPLANT
KIT ROOM TURNOVER OR (KITS) ×2 IMPLANT
NDL HYPO 18GX1.5 BLUNT FILL (NEEDLE) IMPLANT
NDL HYPO 21X1.5 SAFETY (NEEDLE) IMPLANT
NDL HYPO 25X1 1.5 SAFETY (NEEDLE) IMPLANT
NEEDLE HYPO 18GX1.5 BLUNT FILL (NEEDLE) IMPLANT
NEEDLE HYPO 21X1.5 SAFETY (NEEDLE) ×2 IMPLANT
NEEDLE HYPO 25X1 1.5 SAFETY (NEEDLE) ×2 IMPLANT
NS IRRIG 1000ML POUR BTL (IV SOLUTION) ×2 IMPLANT
PACK LAMINECTOMY NEURO (CUSTOM PROCEDURE TRAY) ×2 IMPLANT
PAD ARMBOARD 7.5X6 YLW CONV (MISCELLANEOUS) ×13 IMPLANT
PATTIES SURGICAL .5 X1 (DISPOSABLE) ×2 IMPLANT
PATTIES SURGICAL .5 X3 (DISPOSABLE) IMPLANT
ROD 40MM SPINAL (Rod) ×2 IMPLANT
SCREW CREO THREADED 5.5X45MM (Screw) ×2 IMPLANT
SCREW SPINE 40X5.5XPA CREO (Screw) IMPLANT
SCREW SPINE CREO 5.5X40 (Screw) ×4 IMPLANT
SPACER RISE 8X22 7-13MM (Spacer) ×2 IMPLANT
SPONGE LAP 4X18 X RAY DECT (DISPOSABLE) IMPLANT
SPONGE NEURO XRAY DETECT 1X3 (DISPOSABLE) IMPLANT
SPONGE SURGIFOAM ABS GEL 100 (HEMOSTASIS) ×2 IMPLANT
STRIP CLOSURE SKIN 1/2X4 (GAUZE/BANDAGES/DRESSINGS) ×2 IMPLANT
SUT VIC AB 1 CT1 18XBRD ANBCTR (SUTURE) ×2 IMPLANT
SUT VIC AB 1 CT1 8-18 (SUTURE) ×2
SUT VIC AB 2-0 CP2 18 (SUTURE) ×2 IMPLANT
SUT VIC AB 3-0 SH 8-18 (SUTURE) ×2 IMPLANT
SYR 20CC LL (SYRINGE) ×1 IMPLANT
SYR 20ML ECCENTRIC (SYRINGE) ×2 IMPLANT
SYR 5ML LL (SYRINGE) IMPLANT
TOWEL OR 17X24 6PK STRL BLUE (TOWEL DISPOSABLE) ×2 IMPLANT
TOWEL OR 17X26 10 PK STRL BLUE (TOWEL DISPOSABLE) ×2 IMPLANT
TRAY FOLEY CATH 14FRSI W/METER (CATHETERS) ×2 IMPLANT
WATER STERILE IRR 1000ML POUR (IV SOLUTION) ×2 IMPLANT

## 2014-04-25 NOTE — Progress Notes (Signed)
Call to Dr. Deatra Canter, order rec'd for atenolol p.o. Now.

## 2014-04-25 NOTE — Anesthesia Postprocedure Evaluation (Signed)
  Anesthesia Post-op Note  Patient: [name]  Procedure(s) Performed: Procedure(s): Lumbar five-Sacral One Posterior lumbar interbody fusion (N/A)  Patient Location: PACU  Anesthesia Type:General  Level of Consciousness: awake and alert   Airway and Oxygen Therapy: Patient Spontanous Breathing  Post-op Pain: mild  Post-op Assessment: Post-op Vital signs reviewed  Post-op Vital Signs: Reviewed  Last Vitals:  Filed Vitals:   04/25/14 1251  BP:   Pulse: 63  Temp:   Resp: 25    Complications: No apparent anesthesia complications

## 2014-04-25 NOTE — Anesthesia Procedure Notes (Signed)
Procedure Name: Intubation Date/Time: 04/25/2014 9:52 AM Performed by: Julian Reil Pre-anesthesia Checklist: Patient identified, Emergency Drugs available, Suction available and Patient being monitored Patient Re-evaluated:Patient Re-evaluated prior to inductionOxygen Delivery Method: Circle system utilized Preoxygenation: Pre-oxygenation with 100% oxygen Intubation Type: IV induction Ventilation: Mask ventilation without difficulty Laryngoscope Size: Mac and 3 Grade View: Grade I Tube type: Oral Tube size: 7.0 mm Number of attempts: 1 Airway Equipment and Method: Stylet Placement Confirmation: ETT inserted through vocal cords under direct vision,  positive ETCO2 and breath sounds checked- equal and bilateral Secured at: 20 cm Tube secured with: Tape Dental Injury: Teeth and Oropharynx as per pre-operative assessment

## 2014-04-25 NOTE — Progress Notes (Signed)
Pt has been resting quietly since admission to floor.  At this time, states her pain is a 10/10 and is grabbing railing of bed and moaning.  Also reporting spasms to lower back.  Will medicate and continue to monitor.

## 2014-04-25 NOTE — Clinical Social Work Note (Signed)
CSW Consult Acknowledged:   CSW received a consult for SNF placement. CSW awaiting PT/OT evaluation to determine the appropriate level of care.      Tamsin Nader, MSW, LCSWA [phone]  

## 2014-04-25 NOTE — Progress Notes (Signed)
Orthopedic Tech Progress Note Patient Details:  [name] 08-27-1965 [id number]  Patient ID: [name], female   DOB: 10/16/1965, 49 y.o.   MRN: [id number] [name] from [facility] stated that pt already has aspen lumbar fusion brace that [employer] delivered  Hildred Priest 04/25/2014, 2:13 PM

## 2014-04-25 NOTE — Plan of Care (Signed)
Problem: Consults Goal: Diagnosis - Spinal Surgery Outcome: Completed/Met Date Met:  04/25/14 Thoraco/Lumbar Spine Fusion     

## 2014-04-25 NOTE — Anesthesia Preprocedure Evaluation (Addendum)
Anesthesia Evaluation  Patient identified by MRN, date of birth, ID band Patient awake    Reviewed: Allergy & Precautions, NPO status , Patient's Chart, lab work & pertinent test results  Airway Mallampati: II  TM Distance: >3 FB Neck ROM: Full    Dental  (+) Teeth Intact, Dental Advisory Given   Pulmonary COPDCurrent Smoker,  breath sounds clear to auscultation- rhonchi        Cardiovascular hypertension, Pt. on medications and Pt. on home beta blockers Rhythm:Regular Rate:Normal     Neuro/Psych negative neurological ROS     GI/Hepatic negative GI ROS, Neg liver ROS,   Endo/Other  diabetes, Type 2, Oral Hypoglycemic AgentsMorbid obesity  Renal/GU negative Renal ROS     Musculoskeletal  (+) Arthritis -,   Abdominal   Peds  Hematology negative hematology ROS (+)   Anesthesia Other Findings   Reproductive/Obstetrics                            Anesthesia Physical Anesthesia Plan  ASA: III  Anesthesia Plan: General   Post-op Pain Management:    Induction: Intravenous  Airway Management Planned: Oral ETT  Additional Equipment:   Intra-op Plan:   Post-operative Plan: Extubation in OR  Informed Consent: I have reviewed the patients History and Physical, chart, labs and discussed the procedure including the risks, benefits and alternatives for the proposed anesthesia with the patient or authorized representative who has indicated his/her understanding and acceptance.   Dental advisory given  Plan Discussed with: CRNA  Anesthesia Plan Comments:         Anesthesia Quick Evaluation

## 2014-04-25 NOTE — Progress Notes (Signed)
Called MD about BP in 80s said okay to give valium and to monitor BP. Will continue to monitor pt. Ruben Gottron, RN 04/25/2014 9:54 PM

## 2014-04-25 NOTE — Progress Notes (Deleted)
ANTIBIOTIC CONSULT NOTE - INITIAL  Pharmacy Consult for Vancomycin Indication: surgical prophylaxis  Allergies  Allergen Reactions  . Cephalexin Anaphylaxis and Hives  . Flexeril [Cyclobenzaprine Hcl] Anaphylaxis  . Morphine And Related Anaphylaxis  . Ultram [Tramadol Hcl] Anaphylaxis  . Nsaids Hives, Swelling and Other (See Comments)    Skin feels hot "burning"  . Sulfa Antibiotics Hives and Itching  . Codeine Itching and Rash    Skin feels hot, "burning"    Patient Measurements: Weight: 145 lb (65.772 kg) Adjusted Body Weight:   Vital Signs: Temp: 97.6 F (36.4 C) (04/19 1338) Temp Source: Oral (04/19 0710) BP: 114/75 mmHg (04/19 1335) Pulse Rate: 58 (04/19 1338) Intake/Output from previous day:   Intake/Output from this shift: Total I/O In: 1000 [I.V.:1000] Out: 375 [Urine:150; Drains:25; Blood:200]  Labs: No results for input(s): WBC, HGB, PLT, LABCREA, CREATININE in the last 72 hours. Estimated Creatinine Clearance: 59.7 mL/min (by C-G formula based on Cr of 0.77). No results for input(s): VANCOTROUGH, VANCOPEAK, VANCORANDOM, GENTTROUGH, GENTPEAK, GENTRANDOM, TOBRATROUGH, TOBRAPEAK, TOBRARND, AMIKACINPEAK, AMIKACINTROU, AMIKACIN in the last 72 hours.   Microbiology: Recent Results (from the past 720 hour(s))  Surgical pcr screen     Status: Abnormal   Collection Time: 04/21/14  1:50 PM  Result Value Ref Range Status   MRSA, PCR NEGATIVE NEGATIVE Final   Staphylococcus aureus POSITIVE (A) NEGATIVE Final    Comment:        The Xpert SA Assay (FDA approved for NASAL specimens in patients over 49 years of age), is one component of a comprehensive surveillance program.  Test performance has been validated by [facility] for patients greater than or equal to 56 year old. It is not intended to diagnose infection nor to guide or monitor treatment.     Medical History: Past Medical History  Diagnosis Date  . DDD (degenerative disc disease)   . COPD  (chronic obstructive pulmonary disease)   . Diabetes mellitus   . Hypertension   . Thyroid disease   . Depression   . DDD (degenerative disc disease)   . Degenerative disc disease     Medications:  Prescriptions prior to admission  Medication Sig Dispense Refill Last Dose  . acetaminophen (TYLENOL) 500 MG tablet Take 1,000-3,000 mg by mouth 3 (three) times daily as needed (back pain).    Past Week at Unknown time  . ALPRAZolam (XANAX) 0.5 MG tablet Take 0.5 mg by mouth at bedtime.   04/24/2014 at Unknown time  . aspirin EC 81 MG tablet Take 81 mg by mouth daily.   Past Week at Unknown time  . aspirin-acetaminophen-caffeine (EXCEDRIN MIGRAINE) 250-250-65 MG per tablet Take 2 tablets by mouth 2 (two) times daily as needed for headache or migraine.   Past Week at Unknown time  . atenolol-chlorthalidone (TENORETIC) 100-25 MG per tablet Take 1 tablet by mouth daily.   04/24/2014 at Unknown time  . benztropine (COGENTIN) 1 MG tablet Take 1 mg by mouth at bedtime.   04/24/2014 at Unknown time  . escitalopram (LEXAPRO) 20 MG tablet Take 20 mg by mouth daily.   04/24/2014 at Unknown time  . fish oil-omega-3 fatty acids 1000 MG capsule Take 1 g by mouth 2 (two) times daily.    Past Week at Unknown time  . gabapentin (NEURONTIN) 300 MG capsule Take 300 mg by mouth 3 (three) times daily.   04/24/2014 at Unknown time  . ipratropium-albuterol (DUONEB) 0.5-2.5 (3) MG/3ML SOLN Take 3 mLs by nebulization 4 (four) times  daily.   Past Month at Unknown time  . levothyroxine (SYNTHROID, LEVOTHROID) 25 MCG tablet Take 25 mcg by mouth daily before breakfast.    04/24/2014 at Unknown time  . lisinopril (PRINIVIL,ZESTRIL) 2.5 MG tablet Take 2.5 mg by mouth daily.   04/24/2014 at Unknown time  . lovastatin (MEVACOR) 20 MG tablet Take 20 mg by mouth at bedtime.   04/24/2014 at Unknown time  . metFORMIN (GLUCOPHAGE-XR) 500 MG 24 hr tablet Take 500 mg by mouth 2 (two) times daily.   04/24/2014 at Unknown time  . ranitidine  (ZANTAC) 300 MG tablet Take 300 mg by mouth daily.   04/24/2014 at Unknown time  . albuterol (PROVENTIL HFA;VENTOLIN HFA) 108 (90 BASE) MCG/ACT inhaler Inhale 2 puffs into the lungs every 4 (four) hours as needed for wheezing or shortness of breath. (Patient not taking: Reported on 04/21/2014) 1 Inhaler 0 Not Taking at Unknown time  . ALPRAZolam (XANAX) 0.25 MG tablet Take 1 tablet (0.25 mg total) by mouth at bedtime as needed for anxiety. (Patient not taking: Reported on 04/21/2014) 20 tablet 0 Not Taking at Unknown time  . ciprofloxacin (CIPRO) 500 MG tablet Take 1 tablet (500 mg total) by mouth 2 (two) times daily. One po bid x 7 days (Patient not taking: Reported on 04/21/2014) 14 tablet 0 Not Taking at Unknown time  . HYDROcodone-acetaminophen (NORCO/VICODIN) 5-325 MG per tablet Take 1 tablet by mouth every 6 (six) hours as needed for moderate pain. (Patient not taking: Reported on 04/21/2014) 10 tablet 0 Not Taking at Unknown time   Scheduled:  . aspirin EC  81 mg Oral Daily  . atenolol  100 mg Oral Daily  . benztropine  1 mg Oral QHS  . bupivacaine liposome  20 mL Infiltration To NeurOR  . chlorthalidone  25 mg Oral Daily  . diazepam      . escitalopram  20 mg Oral Daily  . gabapentin  300 mg Oral TID  . HYDROmorphone      . HYDROmorphone      . ipratropium-albuterol  3 mL Nebulization QID  . [START ON 04/26/2014] levothyroxine  25 mcg Oral QAC breakfast  . lisinopril  2.5 mg Oral Daily  . metFORMIN  500 mg Oral BID  . mupirocin ointment      . pravastatin  20 mg Oral q1800  . sodium chloride  3 mL Intravenous Q12H  . vancomycin      . vancomycin       Infusions:  . sodium chloride    . sodium chloride    . lactated ringers 50 mL/hr at 04/25/14 2800   Assessment: 49yo female here for spine surgery. Pharmacy is consulted to dose vancomycin for surgical prophylaxis. Pt is afebrile, WBC is wnl, sCr 0.8, and has hemovac placed in posterior back.  Goal of Therapy:  Vancomycin trough  level 10-15 mcg/ml Prevention of infection  Plan:  Vancomycin 1g IV q12h Measure antibiotic drug levels at steady state Follow up culture results, renal function, and clinical course F/u on drain removal  Andrey Cota. Diona Foley, PharmD Clinical Pharmacist Pager [phone] 04/25/2014,3:36 PM

## 2014-04-25 NOTE — Progress Notes (Signed)
ANTIBIOTIC CONSULT NOTE - INITIAL  Pharmacy Consult for Vancomycin Indication: surgical prophylaxis  Allergies  Allergen Reactions  . Cephalexin Anaphylaxis and Hives  . Flexeril [Cyclobenzaprine Hcl] Anaphylaxis  . Morphine And Related Anaphylaxis  . Ultram [Tramadol Hcl] Anaphylaxis  . Nsaids Hives, Swelling and Other (See Comments)    Skin feels hot "burning"  . Sulfa Antibiotics Hives and Itching  . Codeine Itching and Rash    Skin feels hot, "burning"    Patient Measurements: Weight: 145 lb (65.772 kg)  Vital Signs: Temp: 97.6 F (36.4 C) (04/19 1338) Temp Source: Oral (04/19 0710) BP: 114/75 mmHg (04/19 1335) Pulse Rate: 58 (04/19 1338) Intake/Output from previous day:   Intake/Output from this shift: Total I/O In: 1000 [I.V.:1000] Out: 375 [Urine:150; Drains:25; Blood:200]  Labs: No results for input(s): WBC, HGB, PLT, LABCREA, CREATININE in the last 72 hours. Estimated Creatinine Clearance: 59.7 mL/min (by C-G formula based on Cr of 0.77). No results for input(s): VANCOTROUGH, VANCOPEAK, VANCORANDOM, GENTTROUGH, GENTPEAK, GENTRANDOM, TOBRATROUGH, TOBRAPEAK, TOBRARND, AMIKACINPEAK, AMIKACINTROU, AMIKACIN in the last 72 hours.   Microbiology: Recent Results (from the past 720 hour(s))  Surgical pcr screen     Status: Abnormal   Collection Time: 04/21/14  1:50 PM  Result Value Ref Range Status   MRSA, PCR NEGATIVE NEGATIVE Final   Staphylococcus aureus POSITIVE (A) NEGATIVE Final    Comment:        The Xpert SA Assay (FDA approved for NASAL specimens in patients over 67 years of age), is one component of a comprehensive surveillance program.  Test performance has been validated by [facility] for patients greater than or equal to 68 year old. It is not intended to diagnose infection nor to guide or monitor treatment.     Medical History: Past Medical History  Diagnosis Date  . DDD (degenerative disc disease)   . COPD (chronic obstructive pulmonary  disease)   . Diabetes mellitus   . Hypertension   . Thyroid disease   . Depression   . DDD (degenerative disc disease)   . Degenerative disc disease     Medications:  Prescriptions prior to admission  Medication Sig Dispense Refill Last Dose  . acetaminophen (TYLENOL) 500 MG tablet Take 1,000-3,000 mg by mouth 3 (three) times daily as needed (back pain).    Past Week at Unknown time  . ALPRAZolam (XANAX) 0.5 MG tablet Take 0.5 mg by mouth at bedtime.   04/24/2014 at Unknown time  . aspirin EC 81 MG tablet Take 81 mg by mouth daily.   Past Week at Unknown time  . aspirin-acetaminophen-caffeine (EXCEDRIN MIGRAINE) 250-250-65 MG per tablet Take 2 tablets by mouth 2 (two) times daily as needed for headache or migraine.   Past Week at Unknown time  . atenolol-chlorthalidone (TENORETIC) 100-25 MG per tablet Take 1 tablet by mouth daily.   04/24/2014 at Unknown time  . benztropine (COGENTIN) 1 MG tablet Take 1 mg by mouth at bedtime.   04/24/2014 at Unknown time  . escitalopram (LEXAPRO) 20 MG tablet Take 20 mg by mouth daily.   04/24/2014 at Unknown time  . fish oil-omega-3 fatty acids 1000 MG capsule Take 1 g by mouth 2 (two) times daily.    Past Week at Unknown time  . gabapentin (NEURONTIN) 300 MG capsule Take 300 mg by mouth 3 (three) times daily.   04/24/2014 at Unknown time  . ipratropium-albuterol (DUONEB) 0.5-2.5 (3) MG/3ML SOLN Take 3 mLs by nebulization 4 (four) times daily.   Past  Month at Unknown time  . levothyroxine (SYNTHROID, LEVOTHROID) 25 MCG tablet Take 25 mcg by mouth daily before breakfast.    04/24/2014 at Unknown time  . lisinopril (PRINIVIL,ZESTRIL) 2.5 MG tablet Take 2.5 mg by mouth daily.   04/24/2014 at Unknown time  . lovastatin (MEVACOR) 20 MG tablet Take 20 mg by mouth at bedtime.   04/24/2014 at Unknown time  . metFORMIN (GLUCOPHAGE-XR) 500 MG 24 hr tablet Take 500 mg by mouth 2 (two) times daily.   04/24/2014 at Unknown time  . ranitidine (ZANTAC) 300 MG tablet Take 300 mg  by mouth daily.   04/24/2014 at Unknown time  . albuterol (PROVENTIL HFA;VENTOLIN HFA) 108 (90 BASE) MCG/ACT inhaler Inhale 2 puffs into the lungs every 4 (four) hours as needed for wheezing or shortness of breath. (Patient not taking: Reported on 04/21/2014) 1 Inhaler 0 Not Taking at Unknown time  . ALPRAZolam (XANAX) 0.25 MG tablet Take 1 tablet (0.25 mg total) by mouth at bedtime as needed for anxiety. (Patient not taking: Reported on 04/21/2014) 20 tablet 0 Not Taking at Unknown time  . ciprofloxacin (CIPRO) 500 MG tablet Take 1 tablet (500 mg total) by mouth 2 (two) times daily. One po bid x 7 days (Patient not taking: Reported on 04/21/2014) 14 tablet 0 Not Taking at Unknown time  . HYDROcodone-acetaminophen (NORCO/VICODIN) 5-325 MG per tablet Take 1 tablet by mouth every 6 (six) hours as needed for moderate pain. (Patient not taking: Reported on 04/21/2014) 10 tablet 0 Not Taking at Unknown time   Assessment: 49 y.o. Female s/p L5-S1 fusion. To continue Vancomycin for post-op prophylaxis. Received pre-op Vancomycin 1gm IV ~1000. Noted pt with hemovac drain in place. Scr 0.77, est CrCl 60 ml/min.  Goal of Therapy:  Vancomycin trough level 10-15 mcg/ml  Plan:  Vancomycin 1250mg  IV q24h. First dose tonight at 2200. Will f/u renal function and drain removal  Sherlon Handing, PharmD, BCPS Clinical pharmacist, pager [phone] 04/25/2014,3:30 PM

## 2014-04-25 NOTE — Transfer of Care (Signed)
Immediate Anesthesia Transfer of Care Note  Patient: [name]  Procedure(s) Performed: Procedure(s): Lumbar five-Sacral One Posterior lumbar interbody fusion (N/A)  Patient Location: PACU  Anesthesia Type:General  Level of Consciousness: awake, alert , oriented and patient cooperative  Airway & Oxygen Therapy: Patient Spontanous Breathing and Patient connected to nasal cannula oxygen  Post-op Assessment: Report given to RN, Post -op Vital signs reviewed and stable and Patient moving all extremities  Post vital signs: Reviewed and stable  Last Vitals:  Filed Vitals:   04/25/14 1248  BP:   Pulse: 69  Temp: 36.8 C  Resp: 20    Complications: No apparent anesthesia complications

## 2014-04-26 MED ORDER — NICOTINE 14 MG/24HR TD PT24
14.0000 mg | MEDICATED_PATCH | Freq: Every day | TRANSDERMAL | Status: DC
  Administered 2014-04-26 – 2014-04-28 (×3): 14 mg via TRANSDERMAL
  Filled 2014-04-26 (×3): qty 1

## 2014-04-26 NOTE — Progress Notes (Signed)
CARE MANAGEMENT NOTE 04/26/2014  Patient:  [name], [name]   Account Number:  [id number]  Date Initiated:  04/26/2014  Documentation initiated by:  Olga Coaster  Subjective/Objective Assessment:   ADMITTED FOR SURGERY     Action/Plan:   CM FOLLOWING FOR DCP   Anticipated DC Date:  04/29/2014   Anticipated DC Plan:  AWAITING FOR PT/OT EVALS FOR DISPOSITION NEEDS WITH ATTENDING MD APPROVAL     DC Planning Services  CM consult         Status of service:  In process, will continue to follow  Per UR Regulation:  Reviewed for med. necessity/level of care/duration of stay  Comments:  4/20/2016Mindi Slicker RN,BSN,MHA [phone]

## 2014-04-26 NOTE — Progress Notes (Signed)
Patient ID: [name], female   DOB: 1965/10/29, 49 y.o.   MRN: [id number] C/o incisional pain, no leg pain. No weakness. Getting nebulazer

## 2014-04-26 NOTE — Op Note (Signed)
[name], KLOSE NO.:  [id number]  MEDICAL RECORD NO.:  [id number]  LOCATION:  [id number]                        FACILITY:  [facility]  PHYSICIAN:  Leeroy Cha, M.D.   DATE OF BIRTH:  01-May-1965  DATE OF PROCEDURE:  04/25/2014 DATE OF DISCHARGE:                              OPERATIVE REPORT   PREOPERATIVE DIAGNOSIS:  L5-S1 spondylolisthesis with a chronic radiculopathy.  POSTOPERATIVE DIAGNOSIS:  L5-S1 spondylolisthesis with a chronic radiculopathy.  PROCEDURES:  L5 laminectomy, facetectomy, lysis of adhesion to decompress the L5 and S1 nerve root.  Bilateral diskectomy, more than normal to introduce 2 expandable cages.  Pedicle screws L5-S1, posterolateral arthrodesis with BMP and autograft.  Cell Saver, C-arm.  SURGEON:  Leeroy Cha, M.D.  ASSISTANT:  Dr. Annette Stable.  CLINICAL HISTORY:  The patient is complaining of back pain, worsened to both legs, not better with  conservative treatment.  X-rays showed that she has facet arthropathy with spondylolisthesis.  Surgery was advised and the patient knew the risks and benefits of the surgery. Incidentally, her mother had surgery by me about 6 months ago.  DESCRIPTION OF PROCEDURE:  The patient was taken to the OR, and after intubation, she was positioned in a prone manner.  The back was cleaned with Betadine and later on by DuraPrep.  All drapes were applied. Midline incision, being careful above her mid back tattoo, was made and incision was carried out from L4 down to L5-S1 and retraction__________ all the way laterally.  X-rays showed that the clip was at L4-5.  From then on, we removed the spinous process of L5 and the lamina.  The patient has quite large and loose facet.  Then using the drill, facetectomy was achieved. The patient had quite a bit of stenosis and adhesion at the level of the L5-S1 nerve root and foraminotomy and lysis of adhesion was accomplished.  Then, we entered the disk space first from  the right side and then from the left side and total gross diskectomy was achieved.the patient has a small diameter disc __and We have to be careful not to go through the anterior ligament.  Then, we introduced 2 cages with minimal length of 22 with 8.  It was expanded.  The cages had BMP and autograft inside.  From then on, using the C-arm first in AP view and then on lateral view, we made holes into the pedicle of L5-S1.  All the 4 holes were surrounded by bone.  Then, we introduced 4 screws, two of them 40 and two of them 45 at the level of S1, which were connected with clot and secured in place with caps.  Cross-link from right to left was done.  Then we went laterally and we removed the lateral aspect of the periosteum of the L5 and the ala of the sacrum.  A mix of BMP and autograft was_used for arthrodesis_________.  From then on, the area was irrigated.  We went back and looked at the thecal sac as well as the L5 and S1 nerve root just to be sure that they were free.  From then on, the wound was closed with Vicryl and Steri-Strips.  ______________________________ Leeroy Cha, M.D.     EB/MEDQ  D:  04/25/2014  T:  04/26/2014  Job:  [id number]

## 2014-04-26 NOTE — Clinical Social Work Note (Signed)
CSW reviewed chart per PT's note SNF placement is not the appropriate level of care.The pt will transition home. CSW will sign off.   [place], MSW, [place]

## 2014-04-26 NOTE — Evaluation (Signed)
Physical Therapy Evaluation Patient Details Name: [name] MRN: [id number] DOB: 10-19-1965 Today's Date: 04/26/2014   History of Present Illness  Patient is a 49 y/o female s/p L5-S1 PLIF. PMH of HTN, depression, DM, COPD, PNA, anxiety and migraines.  Clinical Impression  Patient presents with mild balance deficits and weakness s/p above surgery impacting mobility. Education provided on back precautions. Pt slightly impulsive and required cues to maintain back precautions during activity. Pt will have 24/7 S at home. Would benefit from skilled PT to improve transfers, gait, balance and mobility so pt can maximize independence and return to PLOF.     Follow Up Recommendations No PT follow up;Supervision/Assistance - 24 hour    Equipment Recommendations  None recommended by PT    Recommendations for Other Services       Precautions / Restrictions Precautions Precautions: Back Precaution Booklet Issued: Yes (comment) Precaution Comments: Reviewed handout and precautions Required Braces or Orthoses: Spinal Brace Spinal Brace: Lumbar corset Restrictions Weight Bearing Restrictions: No      Mobility  Bed Mobility Overal bed mobility: Needs Assistance Bed Mobility: Rolling;Sidelying to Sit;Sit to Sidelying Rolling: Supervision Sidelying to sit: Min guard;HOB elevated     Sit to sidelying: Min guard;HOB elevated General bed mobility comments: Use of rail for support. Instructed pt in log roll technique.  Transfers Overall transfer level: Needs assistance Equipment used: None Transfers: Sit to/from Stand Sit to Stand: Supervision         General transfer comment: Supervision secondary to impulsivity. Stood from [employer], from toilet x1.  Ambulation/Gait Ambulation/Gait assistance: Min guard Ambulation Distance (Feet): 150 Feet Assistive device: None Gait Pattern/deviations: Step-through pattern;Decreased stride length;Drifts right/left;Decreased dorsiflexion -  right   Gait velocity interpretation: Below normal speed for age/gender General Gait Details: Pt with slow, guarded and unsteady gait at times with drifting noted. Cues to adhere to back precautions during mobility. Decreased foot clearance RLE but no LOB.  Stairs            Wheelchair Mobility    Modified Rankin (Stroke Patients Only)       Balance Overall balance assessment: Needs assistance Sitting-balance support: Feet supported;No upper extremity supported Sitting balance-Leahy Scale: Good Sitting balance - Comments: Able to donn brace EOB with setup.   Standing balance support: During functional activity Standing balance-Leahy Scale: Fair                               Pertinent Vitals/Pain Pain Assessment: No/denies pain    Home Living Family/patient expects to be discharged to:: Private residence Living Arrangements: Parent;Non-relatives/Friends Available Help at Discharge: Family;Available 24 hours/day Type of Home: House Home Access: Level entry     Home Layout: One level Home Equipment: Walker - 2 wheels;Cane - single point;Wheelchair - manual      Prior Function Level of Independence: Independent               Hand Dominance        Extremity/Trunk Assessment   Upper Extremity Assessment: Defer to OT evaluation           Lower Extremity Assessment: Generalized weakness         Communication   Communication: No difficulties  Cognition Arousal/Alertness: Awake/alert Behavior During Therapy: Impulsive;WFL for tasks assessed/performed Overall Cognitive Status: Within Functional Limits for tasks assessed       Memory: Decreased recall of precautions  General Comments General comments (skin integrity, edema, etc.): Pt's mother and sister present in room.     Exercises        Assessment/Plan    PT Assessment Patient needs continued PT services  PT Diagnosis Generalized weakness;Difficulty  walking   PT Problem List Decreased strength;Decreased balance;Decreased mobility;Decreased knowledge of precautions  PT Treatment Interventions Balance training;Gait training;Stair training;Patient/family education;Functional mobility training;Therapeutic activities;Therapeutic exercise   PT Goals (Current goals can be found in the Care Plan section) Acute Rehab PT Goals Patient Stated Goal: to be able to ride that rollercoaster at [employer] PT Goal Formulation: With patient Time For Goal Achievement: 05/10/14 Potential to Achieve Goals: Fair    Frequency Min 5X/week   Barriers to discharge        Co-evaluation               End of Session Equipment Utilized During Treatment: Gait belt;Back brace Activity Tolerance: Patient tolerated treatment well Patient left: in bed;with bed alarm set;with call bell/phone within reach;with family/visitor present Nurse Communication: Mobility status         Time: 2671-2458 PT Time Calculation (min) (ACUTE ONLY): 27 min   Charges:   PT Evaluation $Initial PT Evaluation Tier I: 1 Procedure PT Treatments $Gait Training: 8-22 mins   PT G CodesCandy Sledge A May 03, 2014, 10:19 AM  Candy Sledge, PT, DPT [phone]

## 2014-04-27 MED ORDER — DOCUSATE SODIUM 100 MG PO CAPS
100.0000 mg | ORAL_CAPSULE | Freq: Two times a day (BID) | ORAL | Status: DC
  Administered 2014-04-27 – 2014-04-28 (×3): 100 mg via ORAL
  Filled 2014-04-27 (×3): qty 1

## 2014-04-27 MED ORDER — IPRATROPIUM-ALBUTEROL 0.5-2.5 (3) MG/3ML IN SOLN: 3.0000 mL | RESPIRATORY_TRACT | Status: DC | PRN

## 2014-04-27 MED ORDER — SENNA 8.6 MG PO TABS
1.0000 | ORAL_TABLET | Freq: Two times a day (BID) | ORAL | Status: DC
  Administered 2014-04-27 – 2014-04-28 (×3): 8.6 mg via ORAL
  Filled 2014-04-27 (×2): qty 1

## 2014-04-27 MED ORDER — FLEET ENEMA 7-19 GM/118ML RE ENEM: 1.0000 | ENEMA | Freq: Every day | RECTAL | Status: DC | PRN

## 2014-04-27 NOTE — Evaluation (Signed)
Occupational Therapy Evaluation Patient Details Name: [name] MRN: [id number] DOB: [birth year] Today's Date: 04/27/2014    History of Present Illness Patient is a 49 y/o female s/p L5-S1 PLIF. PMH of HTN, depression, DM, COPD, PNA, anxiety and migraines.   Clinical Impression   PTA pt lived at home and was independent with ADLs. Pt is limited by pain and decreased ROM and requires assist for LB ADLs. Pt will benefit from acute OT for AE training and ADLs to progress to Mod I level to return home.     Follow Up Recommendations  No OT follow up;Supervision/Assistance - 24 hour    Equipment Recommendations  None recommended by OT    Recommendations for Other Services       Precautions / Restrictions Precautions Precautions: Back Precaution Comments: Pt able to verbalize precautions and donn back brace in sitting independently Required Braces or Orthoses: Spinal Brace Spinal Brace: Lumbar corset Restrictions Weight Bearing Restrictions: No      Mobility Bed Mobility Overal bed mobility: Needs Assistance Bed Mobility: Rolling;Sidelying to Sit;Sit to Sidelying Rolling: Supervision Sidelying to sit: Supervision     Sit to sidelying: Supervision General bed mobility comments: Supervision for safety. VC's for sequencing. Good technique. Performed x2  Transfers Overall transfer level: Needs assistance Equipment used: None Transfers: Sit to/from Stand Sit to Stand: Supervision         General transfer comment: Supervision for safety. Pt stood from EOB x3 and toilet x1    Balance Overall balance assessment: Needs assistance         Standing balance support: No upper extremity supported;During functional activity Standing balance-Leahy Scale: Fair Standing balance comment: Able to perform some higher level ambulation (head turns/fast and slow) without LOB, however, general gait without AD is slightly guarded/unsteady.  No LOB noted during session without  AD, just slight unsteadiness/drifting.                             ADL Overall ADL's : Needs assistance/impaired Eating/Feeding: Independent;Sitting   Grooming: Supervision/safety;Standing   Upper Body Bathing: Set up;Sitting   Lower Body Bathing: Moderate assistance;Sit to/from stand   Upper Body Dressing : Set up;Sitting   Lower Body Dressing: Sit to/from stand;Moderate assistance   Toilet Transfer: Supervision/safety;Ambulation;Comfort height toilet   Toileting- Clothing Manipulation and Hygiene: Supervision/safety;Sit to/from stand       Functional mobility during ADLs: Supervision/safety General ADL Comments: Pain limiting pt's ability to reach LB. Pt required assistance to bathe LEs and for pericare. Pt has short arms and difficult to reach around torso for LB ADLs. Pt will have her mother at home to assist. Pt's mother has had surgery multiple times and has altered a whisk to use as a toilet aid. Educated pt on maintaining back precautions during ADLs.     Vision Additional Comments: No apparent deficits.           Pertinent Vitals/Pain Pain Assessment: 0-10 Pain Score: 8  Pain Location: back Pain Descriptors / Indicators: Aching;Spasm Pain Intervention(s): Limited activity within patient's tolerance;Monitored during session;Repositioned;RN gave pain meds during session     Hand Dominance Right   Extremity/Trunk Assessment Upper Extremity Assessment Upper Extremity Assessment: Overall WFL for tasks assessed   Lower Extremity Assessment Lower Extremity Assessment: Defer to PT evaluation   Cervical / Trunk Assessment Cervical / Trunk Assessment: Normal   Communication Communication Communication: No difficulties   Cognition Arousal/Alertness: Awake/alert Behavior During Therapy: [facility] for  tasks assessed/performed Overall Cognitive Status: Within Functional Limits for tasks assessed                                Home Living  Family/patient expects to be discharged to:: Private residence Living Arrangements: Parent;Non-relatives/Friends Available Help at Discharge: Family;Available 24 hours/day Type of Home: House Home Access: Level entry     Home Layout: One level         Bathroom Toilet: Standard     Home Equipment: Walker - 2 wheels;Cane - single point;Wheelchair - manual          Prior Functioning/Environment Level of Independence: Independent             OT Diagnosis: Generalized weakness;Acute pain   OT Problem List: Decreased strength;Decreased range of motion;Decreased activity tolerance;Impaired balance (sitting and/or standing);Decreased knowledge of use of DME or AE;Pain   OT Treatment/Interventions: Self-care/ADL training;Therapeutic exercise;Energy conservation;DME and/or AE instruction;Therapeutic activities;Patient/family education;Balance training    OT Goals(Current goals can be found in the care plan section) Acute Rehab OT Goals Patient Stated Goal: To get back to life OT Goal Formulation: With patient Time For Goal Achievement: 05/11/14 Potential to Achieve Goals: Good ADL Goals Pt Will Perform Grooming: with modified independence;standing Pt Will Perform Lower Body Bathing: with modified independence;sit to/from stand;with adaptive equipment Pt Will Perform Lower Body Dressing: with modified independence;sit to/from stand;with adaptive equipment Pt Will Transfer to Toilet: with modified independence;ambulating Pt Will Perform Toileting - Clothing Manipulation and hygiene: with modified independence;sit to/from stand;with adaptive equipment  OT Frequency: Min 2X/week    End of Session Equipment Utilized During Treatment: Back brace Nurse Communication: Patient requests pain meds  Activity Tolerance: Patient limited by pain;Patient limited by fatigue Patient left: in bed;with call bell/phone within reach;with family/visitor present   Time: 8119-1478 OT Time  Calculation (min): 32 min Charges:  OT General Charges $OT Visit: 1 Procedure OT Evaluation $Initial OT Evaluation Tier I: 1 Procedure OT Treatments $Self Care/Home Management : 8-22 mins G-Codes:    Juluis Rainier 09-May-2014, 2:25 PM  Cyndie Chime, OTR/L Occupational Therapist [phone] (pager)

## 2014-04-27 NOTE — Progress Notes (Signed)
Pt refused HHN tx x2. States she "will be all right". Will change them to PRN and pt knows to call if she needs one.

## 2014-04-27 NOTE — Progress Notes (Signed)
Patient ID: [name], female   DOB: April 22, 1965, 49 y.o.   MRN: [id number] Stable, decrease of pain. C/o constipation.

## 2014-04-27 NOTE — Progress Notes (Signed)
Physical Therapy Treatment Patient Details Name: [name] MRN: [id number] DOB: Nov 14, 1965 Today's Date: 04/27/2014    History of Present Illness Patient is a 49 y/o female s/p L5-S1 PLIF. PMH of HTN, depression, DM, COPD, PNA, anxiety and migraines.    PT Comments    Pt progressing towards PT goals.  Pt demonstrated supervision level function with bed mobility, transfers and ambulation with no AD.  Current plan to d/c home with 24 hour supervision and no PT followup remains appropriate once pt is medically cleared.  Acute PT to follow up for education and reinforcement of precautions with functional movement and for gait mechanics/balance work to help decrease fall risk.    Follow Up Recommendations  No PT follow up;Supervision/Assistance - 24 hour     Equipment Recommendations  None recommended by PT    Recommendations for Other Services       Precautions / Restrictions Precautions Precautions: Back Precaution Comments: Pt able to verbalize precautions and donn back brace in sitting independently Required Braces or Orthoses: Spinal Brace Spinal Brace: Lumbar corset Restrictions Weight Bearing Restrictions: No    Mobility  Bed Mobility Overal bed mobility: Needs Assistance Bed Mobility: Rolling;Sidelying to Sit;Sit to Sidelying Rolling: Supervision Sidelying to sit: Supervision     Sit to sidelying: Supervision General bed mobility comments: Supervision for safety only; pt able to perform bed mobility with min v/c's while maintaining precautions; HOB and handrails were lowered and not used  Transfers Overall transfer level: Needs assistance Equipment used: None Transfers: Sit to/from Stand Sit to Stand: Supervision         General transfer comment:  (Supervision for safety secondary to impulsivity)  Ambulation/Gait Ambulation/Gait assistance: Min guard Ambulation Distance (Feet): 550 Feet Assistive device: None Gait Pattern/deviations: Step-through  pattern;Wide base of support;Drifts right/left;Decreased stride length Gait velocity: Slow Gait velocity interpretation: Below normal speed for age/gender General Gait Details: Gait still guarded/unsteady, wide BOS to compensate, slight lateral drift during ambulation (L>R), was able to tolerate some higher level ambulation (head turns/walking fast/slow) without LOB   Stairs            Wheelchair Mobility    Modified Rankin (Stroke Patients Only)       Balance Overall balance assessment: Needs assistance         Standing balance support: No upper extremity supported;During functional activity Standing balance-Leahy Scale: Fair Standing balance comment: Able to perform some higher level ambulation (head turns/fast and slow) without LOB, however, general gait without AD is slightly guarded/unsteady.  No LOB noted during session without AD, just slight unsteadiness/drifting.                     Cognition Arousal/Alertness: Awake/alert Behavior During Therapy: Impulsive;WFL for tasks assessed/performed Overall Cognitive Status: Within Functional Limits for tasks assessed                      Exercises      General Comments        Pertinent Vitals/Pain Pain Assessment: 0-10 Pain Score: 4  Pain Location: Back Pain Descriptors / Indicators: Spasm (with ambulation) Pain Intervention(s): Monitored during session;Premedicated before session    Home Living                      Prior Function            PT Goals (current goals can now be found in the care plan section) Acute Rehab PT Goals  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 Progress towards PT goals: Progressing toward goals    Frequency  Min 5X/week    PT Plan Current plan remains appropriate    Co-evaluation             End of Session Equipment Utilized During Treatment: Gait belt;Back brace Activity Tolerance: Patient tolerated treatment  well Patient left: in bed;with call bell/phone within reach;with bed alarm set;with family/visitor present     Time: 2706-2376 PT Time Calculation (min) (ACUTE ONLY): 16 min  Charges:  $Gait Training: 8-22 mins                    G Codes:      Arely Tinner May 27, 2014, 1:23 PM  Lucas Mallow, SPT (student physical therapist) Office phone: [phone]

## 2014-04-28 MED ORDER — BISACODYL 10 MG RE SUPP
10.0000 mg | Freq: Every day | RECTAL | Status: DC | PRN
  Administered 2014-04-28: 10 mg via RECTAL
  Filled 2014-04-28: qty 1

## 2014-04-28 NOTE — Discharge Summary (Signed)
Physician Discharge Summary  Patient ID: [name] MRN: [id number] DOB/AGE: 48/18/1967 48 y.o.  Admit date: 04/25/2014 Discharge date: 04/28/2014  Admission Diagnoses:lumbar spondylolisthesis  Discharge Diagnoses:  Active Problems:   Spondylolisthesis of lumbar region   Discharged Condition: no leg pain  Hospital Course: surgry  Consults: none  Significant Diagnostic Studies:mri  Treatments:lumbar fusion  Discharge Exam: Blood pressure 103/45, pulse 76, temperature 98 F (36.7 C), temperature source Oral, resp. rate 16, weight 65.772 kg (145 lb), SpO2 94 %. Wound dry. No weakness  Disposition: home to see me in 3 weeks     Medication List    ASK your doctor about these medications        acetaminophen 500 MG tablet  Commonly known as:  TYLENOL  Take 1,000-3,000 mg by mouth 3 (three) times daily as needed (back pain).     albuterol 108 (90 BASE) MCG/ACT inhaler  Commonly known as:  PROVENTIL HFA;VENTOLIN HFA  Inhale 2 puffs into the lungs every 4 (four) hours as needed for wheezing or shortness of breath.     ALPRAZolam 0.5 MG tablet  Commonly known as:  XANAX  Take 0.5 mg by mouth at bedtime.     ALPRAZolam 0.25 MG tablet  Commonly known as:  XANAX  Take 1 tablet (0.25 mg total) by mouth at bedtime as needed for anxiety.     aspirin EC 81 MG tablet  Take 81 mg by mouth daily.     aspirin-acetaminophen-caffeine 102-725-36 MG per tablet  Commonly known as:  EXCEDRIN MIGRAINE  Take 2 tablets by mouth 2 (two) times daily as needed for headache or migraine.     atenolol-chlorthalidone 100-25 MG per tablet  Commonly known as:  TENORETIC  Take 1 tablet by mouth daily.     benztropine 1 MG tablet  Commonly known as:  COGENTIN  Take 1 mg by mouth at bedtime.     ciprofloxacin 500 MG tablet  Commonly known as:  CIPRO  Take 1 tablet (500 mg total) by mouth 2 (two) times daily. One po bid x 7 days     escitalopram 20 MG tablet  Commonly known as:   LEXAPRO  Take 20 mg by mouth daily.     fish oil-omega-3 fatty acids 1000 MG capsule  Take 1 g by mouth 2 (two) times daily.     gabapentin 300 MG capsule  Commonly known as:  NEURONTIN  Take 300 mg by mouth 3 (three) times daily.     HYDROcodone-acetaminophen 5-325 MG per tablet  Commonly known as:  NORCO/VICODIN  Take 1 tablet by mouth every 6 (six) hours as needed for moderate pain.     ipratropium-albuterol 0.5-2.5 (3) MG/3ML Soln  Commonly known as:  DUONEB  Take 3 mLs by nebulization 4 (four) times daily.     levothyroxine 25 MCG tablet  Commonly known as:  SYNTHROID, LEVOTHROID  Take 25 mcg by mouth daily before breakfast.     lisinopril 2.5 MG tablet  Commonly known as:  PRINIVIL,ZESTRIL  Take 2.5 mg by mouth daily.     lovastatin 20 MG tablet  Commonly known as:  MEVACOR  Take 20 mg by mouth at bedtime.     metFORMIN 500 MG 24 hr tablet  Commonly known as:  GLUCOPHAGE-XR  Take 500 mg by mouth 2 (two) times daily.     ranitidine 300 MG tablet  Commonly known as:  ZANTAC  Take 300 mg by mouth daily.  Signed: Floyce Stakes 04/28/2014, 10:49 AM

## 2014-04-28 NOTE — Progress Notes (Signed)
Pt is being discharged home. Discharge instructions were given to patient and family 

## 2014-04-28 NOTE — Progress Notes (Signed)
Physical Therapy Treatment Patient Details Name: [name] MRN: [id number] DOB: 09-Nov-1965 Today's Date: 04/28/2014    History of Present Illness Patient is a 49 y/o female s/p L5-S1 PLIF. PMH of HTN, depression, DM, COPD, PNA, anxiety and migraines.    PT Comments    Patient progressing well with mobility. Continues to require verbal cues to adhere to back precautions during mobility. Tolerated higher level balance challenges during gait without LOB or difficulty. Pt will have 24/7 S at home at d/c. Able to verbalize back precautions. All goals met except ambulation goal as pt still requiring supervision at times due to reminders for precautions. Pt does not require further skilled therapy services due to functioning at S-Mod I level. Discharge from therapy.   Follow Up Recommendations  No PT follow up;Supervision/Assistance - 24 hour     Equipment Recommendations  None recommended by PT    Recommendations for Other Services       Precautions / Restrictions Precautions Precautions: Back Precaution Booklet Issued: Yes (comment) Precaution Comments: Able to verbalize 3/3 precautions with cues.  Required Braces or Orthoses: Spinal Brace Spinal Brace: Lumbar corset Restrictions Weight Bearing Restrictions: No    Mobility  Bed Mobility Overal bed mobility: Needs Assistance Bed Mobility: Rolling;Sidelying to Sit;Sit to Sidelying Rolling: Modified independent (Device/Increase time) Sidelying to sit: Modified independent (Device/Increase time)     Sit to sidelying: Modified independent (Device/Increase time) General bed mobility comments: HOB flat, no use of rails to simulate home environment. Good demo of log roll technique.  Transfers Overall transfer level: Needs assistance Equipment used: None Transfers: Sit to/from Stand Sit to Stand: Modified independent (Device/Increase time)         General transfer comment: Stood from EOB x1, from toilet x1.    Ambulation/Gait Ambulation/Gait assistance: Supervision Ambulation Distance (Feet): 350 Feet Assistive device: None Gait Pattern/deviations: Step-through pattern;Decreased stride length;Wide base of support Gait velocity: Slow Gait velocity interpretation: Below normal speed for age/gender General Gait Details: Steady gait. Tolerated higher level balance activies- head turns, backward walking, turns etc without LOB. Cues to adhere to back precautions during mobility.    Stairs            Wheelchair Mobility    Modified Rankin (Stroke Patients Only)       Balance Overall balance assessment: Needs assistance Sitting-balance support: Feet supported;No upper extremity supported Sitting balance-Leahy Scale: Good Sitting balance - Comments: Able to donn brace EOB independently.   Standing balance support: During functional activity Standing balance-Leahy Scale: Good               High level balance activites: Side stepping;Backward walking;Direction changes;Turns;Sudden stops;Head turns High Level Balance Comments: Tolerated above activities without LOB or difficulty. Cues to adhere to back precautions.    Cognition Arousal/Alertness: Awake/alert Behavior During Therapy: WFL for tasks assessed/performed Overall Cognitive Status: Within Functional Limits for tasks assessed                      Exercises      General Comments General comments (skin integrity, edema, etc.): Pt's mother present in room.      Pertinent Vitals/Pain Pain Assessment: 0-10 Pain Score: 6  Pain Location: back Pain Descriptors / Indicators: Sore Pain Intervention(s): Monitored during session;Repositioned    Home Living                      Prior Function  PT Goals (current goals can now be found in the care plan section) Progress towards PT goals: Goals met/education completed, patient discharged from PT    Frequency  Min 5X/week    PT Plan  Current plan remains appropriate    Co-evaluation             End of Session Equipment Utilized During Treatment: Gait belt;Back brace Activity Tolerance: Patient tolerated treatment well Patient left: in bed;with call bell/phone within reach;with family/visitor present     Time: 3779-3968 PT Time Calculation (min) (ACUTE ONLY): 19 min  Charges:  $Gait Training: 8-22 mins                    G CodesCandy Sledge A 2014-05-09, 10:02 AM  Candy Sledge, [place], DPT [phone]

## 2015-08-27 ENCOUNTER — Emergency Department (HOSPITAL_COMMUNITY): Payer: Medicaid Other

## 2015-08-27 ENCOUNTER — Emergency Department (HOSPITAL_COMMUNITY)
Admission: EM | Admit: 2015-08-27 | Discharge: 2015-08-27 | Disposition: A | Discharge status: Home / Self Care | Payer: Medicaid Other | Attending: Emergency Medicine | Admitting: Emergency Medicine

## 2015-08-27 ENCOUNTER — Encounter (HOSPITAL_COMMUNITY): Payer: Self-pay | Admitting: Emergency Medicine

## 2015-08-27 DIAGNOSIS — Y929 Unspecified place or not applicable: Secondary | ICD-10-CM | POA: Diagnosis not present

## 2015-08-27 DIAGNOSIS — E119 Type 2 diabetes mellitus without complications: Secondary | ICD-10-CM | POA: Insufficient documentation

## 2015-08-27 DIAGNOSIS — Z79891 Long term (current) use of opiate analgesic: Secondary | ICD-10-CM | POA: Insufficient documentation

## 2015-08-27 DIAGNOSIS — Z7984 Long term (current) use of oral hypoglycemic drugs: Secondary | ICD-10-CM | POA: Diagnosis not present

## 2015-08-27 DIAGNOSIS — Z79899 Other long term (current) drug therapy: Secondary | ICD-10-CM | POA: Insufficient documentation

## 2015-08-27 DIAGNOSIS — J449 Chronic obstructive pulmonary disease, unspecified: Secondary | ICD-10-CM | POA: Insufficient documentation

## 2015-08-27 DIAGNOSIS — S300XXA Contusion of lower back and pelvis, initial encounter: Secondary | ICD-10-CM

## 2015-08-27 DIAGNOSIS — Y939 Activity, unspecified: Secondary | ICD-10-CM | POA: Diagnosis not present

## 2015-08-27 DIAGNOSIS — F17210 Nicotine dependence, cigarettes, uncomplicated: Secondary | ICD-10-CM | POA: Diagnosis not present

## 2015-08-27 DIAGNOSIS — W19XXXA Unspecified fall, initial encounter: Secondary | ICD-10-CM | POA: Diagnosis not present

## 2015-08-27 DIAGNOSIS — Y999 Unspecified external cause status: Secondary | ICD-10-CM | POA: Diagnosis not present

## 2015-08-27 DIAGNOSIS — Z8543 Personal history of malignant neoplasm of ovary: Secondary | ICD-10-CM | POA: Insufficient documentation

## 2015-08-27 DIAGNOSIS — Z7982 Long term (current) use of aspirin: Secondary | ICD-10-CM | POA: Diagnosis not present

## 2015-08-27 DIAGNOSIS — S3992XA Unspecified injury of lower back, initial encounter: Secondary | ICD-10-CM | POA: Diagnosis present

## 2015-08-27 DIAGNOSIS — I10 Essential (primary) hypertension: Secondary | ICD-10-CM | POA: Diagnosis not present

## 2015-08-27 LAB — CBG MONITORING, ED: Glucose-Capillary: 98 mg/dL (ref 65–99)

## 2015-08-27 MED ORDER — HYDROCODONE-ACETAMINOPHEN 5-325 MG PO TABS
2.0000 | ORAL_TABLET | Freq: Once | ORAL | Status: AC
  Administered 2015-08-27: 2 via ORAL
  Filled 2015-08-27: qty 2

## 2015-08-27 NOTE — ED Notes (Signed)
Patient request socks.  Pair given to patient.

## 2015-08-27 NOTE — Discharge Instructions (Signed)
Follow up with your Physician for recheck.  Continue current pain medications.

## 2015-08-27 NOTE — ED Triage Notes (Signed)
Pt states she fell on Saturday and now c/o lower back pain and buttocks pain.

## 2015-08-28 NOTE — ED Provider Notes (Signed)
[facility] DEPT Provider Note   CSN: [id number] Arrival date & time: 08/27/15  2026     History   Chief Complaint Chief Complaint  Patient presents with  . Back Pain    HPI [name] is a 50 y.o. female.  The history is provided by the patient. No language interpreter was used.  Back Pain   This is a chronic problem. The problem occurs constantly. The problem has been gradually worsening. The pain is associated with falling. The pain is present in the lumbar spine. The quality of the pain is described as stabbing. The pain does not radiate. The pain is severe. The pain is the same all the time. Pertinent negatives include no weakness. She has tried nothing for the symptoms. The treatment provided no relief.  Pt reports she fell and hit low back. Pt has had back surgery.  She is concerned that she has messed up her hardware.  Past Medical History:  Diagnosis Date  . Anxiety   . Chronic lower back pain   . COPD (chronic obstructive pulmonary disease) ([place])   . DDD (degenerative disc disease)   . Depression   . Emphysema lung ([place])   . GERD (gastroesophageal reflux disease)   . High cholesterol   . History of hiatal hernia   . Hypertension   . Migraine    "a few times/month" (04/25/2014)  . Ovarian cancer ([place])   . Pneumonia    "once or twice" (04/25/2014)  . Thyroid disease   . Type II diabetes mellitus [facility])     Patient Active Problem List   Diagnosis Date Noted  . Spondylolisthesis of lumbar region 04/25/2014    Past Surgical History:  Procedure Laterality Date  . ABDOMINAL HYSTERECTOMY  ~ 1997  . BACK SURGERY    . [place]; 1995  . DILATION AND CURETTAGE OF UTERUS    . POSTERIOR LUMBAR FUSION  04/25/2014   L5-S1    OB History    Gravida Para Term Preterm AB Living   2 2 2     2    SAB TAB Ectopic Multiple Live Births                   Home Medications    Prior to Admission medications   Medication Sig Start Date End Date  Taking? Authorizing Provider  acetaminophen (TYLENOL) 500 MG tablet Take 1,000-3,000 mg by mouth 3 (three) times daily as needed (back pain).    Yes Historical Provider, MD  albuterol (PROVENTIL HFA;VENTOLIN HFA) 108 (90 BASE) MCG/ACT inhaler Inhale 2 puffs into the lungs every 4 (four) hours as needed for wheezing or shortness of breath. 04/17/11  Yes Charlena Cross, MD  ALPRAZolam Duanne Moron) 0.25 MG tablet Take 1 tablet (0.25 mg total) by mouth at bedtime as needed for anxiety. 02/24/13  Yes Elisha Headland, FNP  aspirin EC 81 MG tablet Take 81 mg by mouth daily.   Yes Historical Provider, MD  aspirin-acetaminophen-caffeine (EXCEDRIN MIGRAINE) [phone] MG per tablet Take 2 tablets by mouth 2 (two) times daily as needed for headache or migraine.   Yes Historical Provider, MD  atenolol (TENORMIN) 100 MG tablet Take 100 mg by mouth daily.   Yes Historical Provider, MD  atenolol-chlorthalidone (TENORETIC) 100-25 MG per tablet Take 1 tablet by mouth daily.   Yes Historical Provider, MD  benztropine (COGENTIN) 1 MG tablet Take 1 mg by mouth at bedtime.   Yes Historical Provider, MD  DULoxetine (CYMBALTA) 30  MG capsule Take 30 mg by mouth daily.   Yes Historical Provider, MD  escitalopram (LEXAPRO) 20 MG tablet Take 20 mg by mouth daily.   Yes Historical Provider, MD  fish oil-omega-3 fatty acids 1000 MG capsule Take 1 g by mouth 2 (two) times daily.    Yes Historical Provider, MD  gabapentin (NEURONTIN) 300 MG capsule Take 300 mg by mouth 3 (three) times daily.   Yes Historical Provider, MD  ipratropium-albuterol (DUONEB) 0.5-2.5 (3) MG/3ML SOLN Take 3 mLs by nebulization 4 (four) times daily.   Yes Historical Provider, MD  levothyroxine (SYNTHROID, LEVOTHROID) 25 MCG tablet Take 25 mcg by mouth daily before breakfast.    Yes Historical Provider, MD  lovastatin (MEVACOR) 20 MG tablet Take 20 mg by mouth at bedtime.   Yes Historical Provider, MD  metFORMIN (GLUCOPHAGE-XR) 500 MG 24 hr tablet Take 500 mg by  mouth 2 (two) times daily.   Yes Historical Provider, MD  oxyCODONE-acetaminophen (PERCOCET) 10-325 MG tablet Take 1 tablet by mouth every 8 (eight) hours as needed for pain.   Yes Historical Provider, MD  ranitidine (ZANTAC) 300 MG tablet Take 300 mg by mouth daily.   Yes Historical Provider, MD  ciprofloxacin (CIPRO) 500 MG tablet Take 1 tablet (500 mg total) by mouth 2 (two) times daily. One po bid x 7 days Patient not taking: Reported on 04/21/2014 01/09/14   Milton Ferguson, MD  HYDROcodone-acetaminophen (NORCO/VICODIN) 5-325 MG per tablet Take 1 tablet by mouth every 6 (six) hours as needed for moderate pain. Patient not taking: Reported on 04/21/2014 01/09/14   Milton Ferguson, MD  lisinopril (PRINIVIL,ZESTRIL) 2.5 MG tablet Take 2.5 mg by mouth daily.    Historical Provider, MD    Family History Family History  Problem Relation Age of Onset  . Diabetes Brother   . Hypertension Other   . Diabetes Other   . Thyroid disease Other   . Hyperlipidemia Other     Social History Social History  Substance Use Topics  . Smoking status: Current Every Day Smoker    Packs/day: 0.50    Years: 32.00    Types: Cigarettes  . Smokeless tobacco: Never Used  . Alcohol use Yes     Comment: 04/25/2014 "I only drink on New Years Eve"     Allergies   Cephalexin; Flexeril [cyclobenzaprine hcl]; Morphine and related; Ultram [tramadol hcl]; Nsaids; Sulfa antibiotics; and Codeine   Review of Systems Review of Systems  Musculoskeletal: Positive for back pain.  Neurological: Negative for weakness.  All other systems reviewed and are negative.    Physical Exam Updated Vital Signs BP 136/94 (BP Location: Left Arm)   Pulse 88   Temp 98.2 F (36.8 C) (Oral)   Resp 20   Ht 4\' 2"  (1.27 m)   Wt 59.6 kg   SpO2 98%   BMI 36.93 kg/m   Physical Exam  Constitutional: She appears well-developed and well-nourished. No distress.  HENT:  Head: Normocephalic and atraumatic.  Eyes: Conjunctivae are normal.   Neck: Neck supple.  Cardiovascular: Normal rate and regular rhythm.   No murmur heard. Pulmonary/Chest: Effort normal and breath sounds normal. No respiratory distress.  Abdominal: Soft. There is no tenderness.  Musculoskeletal: She exhibits no edema.  Diffusely tender lumbar spine. Well healed incision  Neurological: She is alert.  Skin: Skin is warm and dry.  Psychiatric: She has a normal mood and affect.  Nursing note and vitals reviewed.    ED Treatments / Results  Labs (  all labs ordered are listed, but only abnormal results are displayed) Labs Reviewed  CBG MONITORING, ED    EKG  EKG Interpretation None       Radiology Dg Lumbar Spine Complete  Result Date: 08/27/2015 CLINICAL DATA:  Fall, low back pain EXAM: LUMBAR SPINE - COMPLETE 4+ VIEW COMPARISON:  08/20/2015 FINDINGS: Five lumbar type vertebral bodies. Normal lumbar lordosis. Status post PLIF at L5-S1, unchanged. No evidence of hardware loosening or fracture. No evidence of fracture or dislocation. Vertebral body heights are maintained. Visualized bony pelvis appears intact. IMPRESSION: No fracture or dislocation is seen. Status post PLIF at L5-S1.  No evidence of hardware complication. Electronically Signed   By: Julian Hy M.D.   On: 08/27/2015 23:04    Procedures Procedures (including critical care time)  Medications Ordered in ED Medications  HYDROcodone-acetaminophen (NORCO/VICODIN) 5-325 MG per tablet 2 tablet (2 tablets Oral Given 08/27/15 2305)     Initial Impression / Assessment and Plan / ED Course  I have reviewed the triage vital signs and the nursing notes.  Pertinent labs & imaging results that were available during my care of the patient were reviewed by me and considered in my medical decision making (see chart for details).  Clinical Course  Value Comment By Time  DG Lumbar Spine Complete (Reviewed) Fransico Meadow, PA-C 08/21 2316    Pt given 2 vicoden here.  Pt has rx for pain  medication at home.  I advised her to take home medication as directed.  Final Clinical Impressions(s) / ED Diagnoses   Final diagnoses:  Contusion of lower back, initial encounter    New Prescriptions Discharge Medication List as of 08/27/2015 11:19 PM     Meds ordered this encounter  Medications  . HYDROcodone-acetaminophen (NORCO/VICODIN) 5-325 MG per tablet 2 tablet  . atenolol (TENORMIN) 100 MG tablet    Sig: Take 100 mg by mouth daily.  Marland Kitchen oxyCODONE-acetaminophen (PERCOCET) 10-325 MG tablet    Sig: Take 1 tablet by mouth every 8 (eight) hours as needed for pain.  . DULoxetine (CYMBALTA) 30 MG capsule    Sig: Take 30 mg by mouth daily.     Hollace Kinnier [place], PA-C 08/28/15 0102    Milton Ferguson, MD 08/29/15 [phone]

## 2016-01-02 ENCOUNTER — Emergency Department (HOSPITAL_COMMUNITY)
Admission: EM | Admit: 2016-01-02 | Discharge: 2016-01-02 | Disposition: A | Discharge status: Home / Self Care | Payer: Medicaid Other | Attending: Emergency Medicine | Admitting: Emergency Medicine

## 2016-01-02 ENCOUNTER — Encounter (HOSPITAL_COMMUNITY): Payer: Self-pay | Admitting: Cardiology

## 2016-01-02 ENCOUNTER — Emergency Department (HOSPITAL_COMMUNITY): Payer: Medicaid Other

## 2016-01-02 DIAGNOSIS — I10 Essential (primary) hypertension: Secondary | ICD-10-CM | POA: Insufficient documentation

## 2016-01-02 DIAGNOSIS — F17210 Nicotine dependence, cigarettes, uncomplicated: Secondary | ICD-10-CM | POA: Diagnosis not present

## 2016-01-02 DIAGNOSIS — Z79899 Other long term (current) drug therapy: Secondary | ICD-10-CM | POA: Insufficient documentation

## 2016-01-02 DIAGNOSIS — Z7982 Long term (current) use of aspirin: Secondary | ICD-10-CM | POA: Diagnosis not present

## 2016-01-02 DIAGNOSIS — E119 Type 2 diabetes mellitus without complications: Secondary | ICD-10-CM | POA: Insufficient documentation

## 2016-01-02 DIAGNOSIS — J449 Chronic obstructive pulmonary disease, unspecified: Secondary | ICD-10-CM | POA: Insufficient documentation

## 2016-01-02 DIAGNOSIS — Z7984 Long term (current) use of oral hypoglycemic drugs: Secondary | ICD-10-CM | POA: Insufficient documentation

## 2016-01-02 DIAGNOSIS — Z8543 Personal history of malignant neoplasm of ovary: Secondary | ICD-10-CM | POA: Diagnosis not present

## 2016-01-02 DIAGNOSIS — J40 Bronchitis, not specified as acute or chronic: Secondary | ICD-10-CM | POA: Diagnosis not present

## 2016-01-02 DIAGNOSIS — R05 Cough: Secondary | ICD-10-CM | POA: Diagnosis present

## 2016-01-02 MED ORDER — PREDNISONE 50 MG PO TABS
60.0000 mg | ORAL_TABLET | Freq: Once | ORAL | Status: AC
  Administered 2016-01-02: 60 mg via ORAL
  Filled 2016-01-02: qty 1

## 2016-01-02 MED ORDER — AZITHROMYCIN 250 MG PO TABS: 250.0000 mg | ORAL_TABLET | Freq: Every day | ORAL | 0 refills | Status: DC

## 2016-01-02 MED ORDER — PREDNISONE 20 MG PO TABS
40.0000 mg | ORAL_TABLET | Freq: Every day | ORAL | 0 refills | Status: DC
Start: 2016-01-02 — End: 2020-09-04

## 2016-01-02 MED ORDER — ALBUTEROL SULFATE (2.5 MG/3ML) 0.083% IN NEBU
5.0000 mg | INHALATION_SOLUTION | Freq: Once | RESPIRATORY_TRACT | Status: AC
  Administered 2016-01-02: 5 mg via RESPIRATORY_TRACT
  Filled 2016-01-02: qty 6

## 2016-01-02 MED ORDER — OXYCODONE-ACETAMINOPHEN 5-325 MG PO TABS
1.0000 | ORAL_TABLET | Freq: Once | ORAL | Status: AC
  Administered 2016-01-02: 1 via ORAL
  Filled 2016-01-02: qty 1

## 2016-01-02 NOTE — ED Provider Notes (Signed)
[facility] DEPT Provider Note   CSN: [id number] Arrival date & time: 01/02/16  1015    By signing my name below, I, Macon Large, attest that this documentation has been prepared under the direction and in the presence of Virgel Manifold, MD. Electronically Signed: Macon Large, ED Scribe. 01/02/16. 10:46 AM.  History   Chief Complaint Chief Complaint  Patient presents with  . Cough   The history is provided by the patient. No language interpreter was used.   HPI Comments: [name] is a 50 y.o. female with PMHx of chronic lower back pain, emphysema lung and pneumonia, who presents to the Emergency Department complaining of moderate, frequent, cough onset three days ago. Pt reports associated back pain, upper abdominal pain and SOB. Pt notes she has a PMHx of pneumonia. She reports being admitted to the hospital for seven days due to having double pneumonia. She states having a subjective fever at home. Pt's temperature in the ED today was 98.3. No alleviating factors noted. Pt denies CP, weakness, difficulty urinating, nausea, chills and HA.   Past Medical History:  Diagnosis Date  . Anxiety   . Chronic lower back pain   . COPD (chronic obstructive pulmonary disease) ([place])   . DDD (degenerative disc disease)   . Depression   . Emphysema lung ([place])   . GERD (gastroesophageal reflux disease)   . High cholesterol   . History of hiatal hernia   . Hypertension   . Migraine    "a few times/month" (04/25/2014)  . Ovarian cancer ([place])   . Pneumonia    "once or twice" (04/25/2014)  . Thyroid disease   . Type II diabetes mellitus [facility])     Patient Active Problem List   Diagnosis Date Noted  . Spondylolisthesis of lumbar region 04/25/2014    Past Surgical History:  Procedure Laterality Date  . ABDOMINAL HYSTERECTOMY  ~ 1997  . BACK SURGERY    . [place]; 1995  . DILATION AND CURETTAGE OF UTERUS    . POSTERIOR LUMBAR FUSION  04/25/2014   L5-S1    OB History    Gravida Para Term Preterm AB Living   2 2 2     2    SAB TAB Ectopic Multiple Live Births                   Home Medications    Prior to Admission medications   Medication Sig Start Date End Date Taking? Authorizing Provider  acetaminophen (TYLENOL) 500 MG tablet Take 1,000-3,000 mg by mouth 3 (three) times daily as needed (back pain).     Historical Provider, MD  albuterol (PROVENTIL HFA;VENTOLIN HFA) 108 (90 BASE) MCG/ACT inhaler Inhale 2 puffs into the lungs every 4 (four) hours as needed for wheezing or shortness of breath. 04/17/11   Charlena Cross, MD  ALPRAZolam Duanne Moron) 0.25 MG tablet Take 1 tablet (0.25 mg total) by mouth at bedtime as needed for anxiety. 02/24/13   Elisha Headland, FNP  aspirin EC 81 MG tablet Take 81 mg by mouth daily.    Historical Provider, MD  aspirin-acetaminophen-caffeine (EXCEDRIN MIGRAINE) [phone] MG per tablet Take 2 tablets by mouth 2 (two) times daily as needed for headache or migraine.    Historical Provider, MD  atenolol (TENORMIN) 100 MG tablet Take 100 mg by mouth daily.    Historical Provider, MD  atenolol-chlorthalidone (TENORETIC) 100-25 MG per tablet Take 1 tablet by mouth daily.    Historical Provider, MD  benztropine (COGENTIN) 1 MG tablet Take 1 mg by mouth at bedtime.    Historical Provider, MD  ciprofloxacin (CIPRO) 500 MG tablet Take 1 tablet (500 mg total) by mouth 2 (two) times daily. One po bid x 7 days Patient not taking: Reported on 04/21/2014 01/09/14   Milton Ferguson, MD  DULoxetine (CYMBALTA) 30 MG capsule Take 30 mg by mouth daily.    Historical Provider, MD  escitalopram (LEXAPRO) 20 MG tablet Take 20 mg by mouth daily.    Historical Provider, MD  fish oil-omega-3 fatty acids 1000 MG capsule Take 1 g by mouth 2 (two) times daily.     Historical Provider, MD  gabapentin (NEURONTIN) 300 MG capsule Take 300 mg by mouth 3 (three) times daily.    Historical Provider, MD  HYDROcodone-acetaminophen (NORCO/VICODIN)  5-325 MG per tablet Take 1 tablet by mouth every 6 (six) hours as needed for moderate pain. Patient not taking: Reported on 04/21/2014 01/09/14   Milton Ferguson, MD  ipratropium-albuterol (DUONEB) 0.5-2.5 (3) MG/3ML SOLN Take 3 mLs by nebulization 4 (four) times daily.    Historical Provider, MD  levothyroxine (SYNTHROID, LEVOTHROID) 25 MCG tablet Take 25 mcg by mouth daily before breakfast.     Historical Provider, MD  lisinopril (PRINIVIL,ZESTRIL) 2.5 MG tablet Take 2.5 mg by mouth daily.    Historical Provider, MD  lovastatin (MEVACOR) 20 MG tablet Take 20 mg by mouth at bedtime.    Historical Provider, MD  metFORMIN (GLUCOPHAGE-XR) 500 MG 24 hr tablet Take 500 mg by mouth 2 (two) times daily.    Historical Provider, MD  oxyCODONE-acetaminophen (PERCOCET) 10-325 MG tablet Take 1 tablet by mouth every 8 (eight) hours as needed for pain.    Historical Provider, MD  ranitidine (ZANTAC) 300 MG tablet Take 300 mg by mouth daily.    Historical Provider, MD    Family History Family History  Problem Relation Age of Onset  . Diabetes Brother   . Hypertension Other   . Diabetes Other   . Thyroid disease Other   . Hyperlipidemia Other     Social History Social History  Substance Use Topics  . Smoking status: Current Every Day Smoker    Packs/day: 0.50    Years: 32.00    Types: Cigarettes  . Smokeless tobacco: Never Used  . Alcohol use Yes     Comment: 04/25/2014 "I only drink on New Years Eve"     Allergies   Cephalexin; Flexeril [cyclobenzaprine hcl]; Morphine and related; Ultram [tramadol hcl]; Nsaids; Sulfa antibiotics; and Codeine   Review of Systems Review of Systems  Constitutional: Negative for chills.  Respiratory: Positive for cough and shortness of breath.   Cardiovascular: Negative for chest pain.  Gastrointestinal: Positive for abdominal pain. Negative for nausea.  Genitourinary: Negative for difficulty urinating.  Musculoskeletal: Positive for back pain.  Neurological:  Negative for weakness and headaches.  All other systems reviewed and are negative.    Physical Exam Updated Vital Signs BP 119/62   Pulse 69   Temp 98.3 F (36.8 C) (Oral)   Resp 16   Ht 4\' 2"  (1.27 m)   Wt 135 lb (61.2 kg)   SpO2 98%   BMI 37.97 kg/m   Physical Exam  Constitutional: She appears well-developed and well-nourished. No distress.  Speaking in complete sentences.   HENT:  Head: Normocephalic and atraumatic.  Mouth/Throat: Oropharynx is clear and moist. No oropharyngeal exudate.  Eyes: Conjunctivae and EOM are normal. Pupils are equal, round, and reactive to  light. Right eye exhibits no discharge. Left eye exhibits no discharge. No scleral icterus.  Neck: Normal range of motion. Neck supple. No JVD present. No thyromegaly present.  Cardiovascular: Normal rate, regular rhythm, normal heart sounds and intact distal pulses.  Exam reveals no gallop and no friction rub.   No murmur heard. Pulmonary/Chest: Effort normal. No respiratory distress. She has wheezes. She has no rales.  Faint expiratory wheezing bilaterally. Frequent cough.   Abdominal: Soft. Bowel sounds are normal. She exhibits no distension and no mass. There is no tenderness.  Musculoskeletal: Normal range of motion. She exhibits no edema or tenderness.  Lymphadenopathy:    She has no cervical adenopathy.  Neurological: She is alert. Coordination normal.  Skin: Skin is warm and dry. No rash noted. No erythema.  Psychiatric: She has a normal mood and affect. Her behavior is normal.  Nursing note and vitals reviewed.    ED Treatments / Results   DIAGNOSTIC STUDIES: Oxygen Saturation is 98% on RA, normal by my interpretation.    COORDINATION OF CARE: 10:44 AM Discussed treatment plan with pt at bedside which includes chest XR and pt agreed to plan.   Labs (all labs ordered are listed, but only abnormal results are displayed) Labs Reviewed - No data to display  EKG  EKG Interpretation None         Radiology Dg Chest 2 View  Result Date: 01/02/2016 CLINICAL DATA:  Productive cough.  Back pain EXAM: CHEST  2 VIEW COMPARISON:  10/03/2014 FINDINGS: The heart size and mediastinal contours are within normal limits. Both lungs are clear. The visualized skeletal structures are unremarkable. IMPRESSION: No active cardiopulmonary disease. Electronically Signed   By: Franchot Gallo M.D.   On: 01/02/2016 11:00    Procedures Procedures (including critical care time)  Medications Ordered in ED Medications  albuterol (PROVENTIL) (2.5 MG/3ML) 0.083% nebulizer solution 5 mg (5 mg Nebulization Given 01/02/16 1101)  predniSONE (DELTASONE) tablet 60 mg (60 mg Oral Given 01/02/16 1114)  oxyCODONE-acetaminophen (PERCOCET/ROXICET) 5-325 MG per tablet 1 tablet (1 tablet Oral Given 01/02/16 1129)     Initial Impression / Assessment and Plan / ED Course  I have reviewed the triage vital signs and the nursing notes.  Pertinent labs & imaging results that were available during my care of the patient were reviewed by me and considered in my medical decision making (see chart for details).  Clinical Course    50 year old female with cough and upper back pain. Suspect viral respiratory infection. Some mild wheezing on exam. Her breathing is not significantly increased. Oxygen saturations are adequate on room air. Plan course of steroids. Return precautions discussed.  Final Clinical Impressions(s) / ED Diagnoses   Final diagnoses:  Bronchitis    New Prescriptions New Prescriptions   No medications on file    I personally preformed the services scribed in my presence. The recorded information has been reviewed is accurate. Virgel Manifold, MD.     Virgel Manifold, MD 01/04/16 [phone]

## 2016-01-02 NOTE — ED Triage Notes (Signed)
Chest congestion times 2-3 days.  Pain in upper back.

## 2016-02-28 ENCOUNTER — Other Ambulatory Visit: Payer: Self-pay | Admitting: Obstetrics and Gynecology

## 2016-03-05 IMAGING — CR DG LUMBAR SPINE 2-3V
1 series · 1 of 1 positions shown · non-contrast
Comparison: Lumbar spine MR dated 04/06/2014.

CLINICAL DATA: L5-S1 fusion for spondylolisthesis.

EXAM:
LUMBAR SPINE - 2-3 VIEW

[lat]
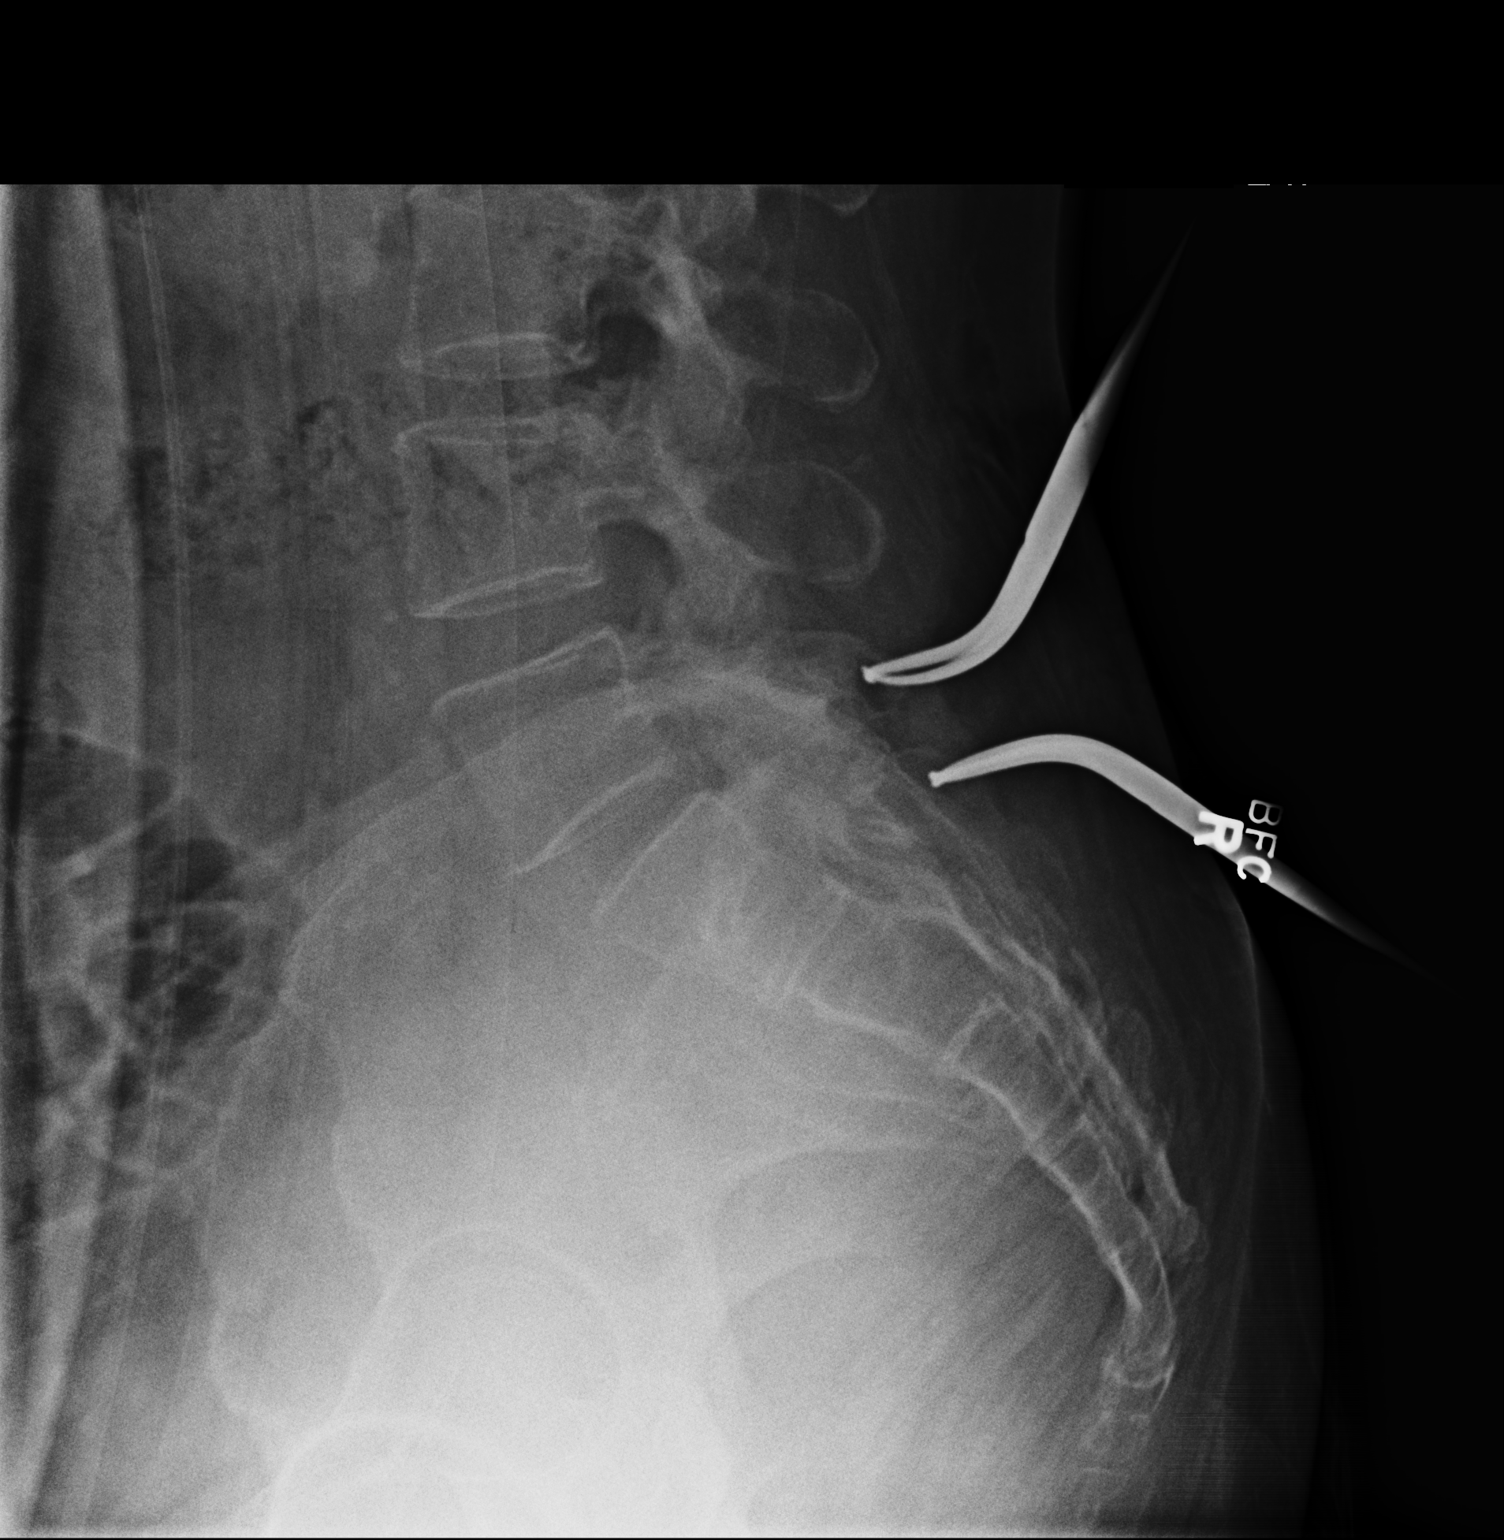

[1 of 1 positions shown; findings below may reference images not displayed]

FINDINGS: An initial portable cross-table lateral view the lumbar spine
demonstrates metallic clamps overlying the L5 and S1 spinous
processes. A subsequent image demonstrates a localizer with its tip
projected posteriorly at the L5-S1 level.
IMPRESSION: Localizers, as described above.

## 2016-10-08 ENCOUNTER — Other Ambulatory Visit: Payer: Self-pay | Admitting: Obstetrics and Gynecology

## 2016-10-22 ENCOUNTER — Encounter: Payer: Self-pay | Admitting: Obstetrics and Gynecology

## 2016-10-22 ENCOUNTER — Ambulatory Visit (INDEPENDENT_AMBULATORY_CARE_PROVIDER_SITE_OTHER): Payer: Medicaid Other | Admitting: Obstetrics and Gynecology

## 2016-10-22 ENCOUNTER — Encounter (INDEPENDENT_AMBULATORY_CARE_PROVIDER_SITE_OTHER): Payer: Self-pay

## 2016-10-22 VITALS — BP 120/80 | HR 61 | Ht <= 58 in | Wt 130.0 lb

## 2016-10-22 DIAGNOSIS — Z01411 Encounter for gynecological examination (general) (routine) with abnormal findings: Secondary | ICD-10-CM | POA: Diagnosis not present

## 2016-10-22 DIAGNOSIS — N941 Unspecified dyspareunia: Secondary | ICD-10-CM

## 2016-10-22 DIAGNOSIS — Z0001 Encounter for general adult medical examination with abnormal findings: Secondary | ICD-10-CM | POA: Diagnosis not present

## 2016-10-22 DIAGNOSIS — N952 Postmenopausal atrophic vaginitis: Secondary | ICD-10-CM | POA: Diagnosis not present

## 2016-10-22 NOTE — Progress Notes (Signed)
Patient ID: [name], female   DOB: 09/10/1965, 51 y.o.   MRN: [id number]   [facility] Visit  @DATE @            Patient name: [name] MRN [id number]  Date of birth: 1965-03-13  CC & HPI:  [name] is a 51 y.o. female presenting today for an annual physical exam. Pt also endorses dyspareunia, but reports she hasn't had intercourse recently. She reports her partner is "  13 inches." Hx of hysterectomy. Pt denies any other symptoms or complaints at this time.   ROS:  ROS +dyspareunia All systems are negative except as noted in the HPI and PMH.   Pertinent History Reviewed:   Reviewed: Significant for hysterectomy Medical         Past Medical History:  Diagnosis Date  . Anxiety   . Chronic lower back pain   . COPD (chronic obstructive pulmonary disease) ([place])   . DDD (degenerative disc disease)   . Depression   . Emphysema lung ([place])   . GERD (gastroesophageal reflux disease)   . High cholesterol   . History of hiatal hernia   . Hypertension   . Migraine    "a few times/month" (04/25/2014)  . Ovarian cancer ([place])   . Pneumonia    "once or twice" (04/25/2014)  . Thyroid disease   . Type II diabetes mellitus ([place])                               Surgical Hx:    Past Surgical History:  Procedure Laterality Date  . ABDOMINAL HYSTERECTOMY  ~ 1997  . BACK SURGERY    . [place]; 1995  . DILATION AND CURETTAGE OF UTERUS    . POSTERIOR LUMBAR FUSION  04/25/2014   L5-S1   Medications: Reviewed & Updated - see associated section                       Current Outpatient Prescriptions:  .  buprenorphine-naloxone (SUBOXONE) 8-2 mg SUBL SL tablet, Place 1 tablet under the tongue daily., Disp: , Rfl:  .  acetaminophen (TYLENOL) 500 MG tablet, Take 1,000-3,000 mg by mouth 3 (three) times daily as needed (back pain). , Disp: , Rfl:  .  albuterol (PROVENTIL HFA;VENTOLIN HFA) 108 (90 BASE) MCG/ACT inhaler, Inhale 2 puffs into the lungs  every 4 (four) hours as needed for wheezing or shortness of breath., Disp: 1 Inhaler, Rfl: 0 .  ALPRAZolam (XANAX) 0.25 MG tablet, Take 1 tablet (0.25 mg total) by mouth at bedtime as needed for anxiety., Disp: 20 tablet, Rfl: 0 .  aspirin EC 81 MG tablet, Take 81 mg by mouth daily., Disp: , Rfl:  .  aspirin-acetaminophen-caffeine (EXCEDRIN MIGRAINE) 250-250-65 MG per tablet, Take 2 tablets by mouth 2 (two) times daily as needed for headache or migraine., Disp: , Rfl:  .  atenolol (TENORMIN) 100 MG tablet, Take 100 mg by mouth daily., Disp: , Rfl:  .  atenolol-chlorthalidone (TENORETIC) 100-25 MG per tablet, Take 1 tablet by mouth daily., Disp: , Rfl:  .  azithromycin (ZITHROMAX) 250 MG tablet, Take 1 tablet (250 mg total) by mouth daily. Take first 2 tablets together, then 1 every day until finished., Disp: 6 tablet, Rfl: 0 .  benztropine (COGENTIN) 1 MG tablet, Take 1 mg by mouth at bedtime., Disp: , Rfl:  .  ciprofloxacin (CIPRO)  500 MG tablet, Take 1 tablet (500 mg total) by mouth 2 (two) times daily. One po bid x 7 days (Patient not taking: Reported on 04/21/2014), Disp: 14 tablet, Rfl: 0 .  DULoxetine (CYMBALTA) 30 MG capsule, Take 30 mg by mouth daily., Disp: , Rfl:  .  escitalopram (LEXAPRO) 20 MG tablet, Take 20 mg by mouth daily., Disp: , Rfl:  .  fish oil-omega-3 fatty acids 1000 MG capsule, Take 1 g by mouth 2 (two) times daily. , Disp: , Rfl:  .  gabapentin (NEURONTIN) 300 MG capsule, Take 300 mg by mouth 3 (three) times daily., Disp: , Rfl:  .  HYDROcodone-acetaminophen (NORCO/VICODIN) 5-325 MG per tablet, Take 1 tablet by mouth every 6 (six) hours as needed for moderate pain. (Patient not taking: Reported on 04/21/2014), Disp: 10 tablet, Rfl: 0 .  ipratropium-albuterol (DUONEB) 0.5-2.5 (3) MG/3ML SOLN, Take 3 mLs by nebulization 4 (four) times daily., Disp: , Rfl:  .  levothyroxine (SYNTHROID, LEVOTHROID) 25 MCG tablet, Take 25 mcg by mouth daily before breakfast. , Disp: , Rfl:  .   lisinopril (PRINIVIL,ZESTRIL) 2.5 MG tablet, Take 2.5 mg by mouth daily., Disp: , Rfl:  .  lovastatin (MEVACOR) 20 MG tablet, Take 20 mg by mouth at bedtime., Disp: , Rfl:  .  metFORMIN (GLUCOPHAGE-XR) 500 MG 24 hr tablet, Take 500 mg by mouth 2 (two) times daily., Disp: , Rfl:  .  predniSONE (DELTASONE) 20 MG tablet, Take 2 tablets (40 mg total) by mouth daily., Disp: 10 tablet, Rfl: 0 .  ranitidine (ZANTAC) 300 MG tablet, Take 300 mg by mouth daily., Disp: , Rfl:    Social History: Reviewed -  reports that she has been smoking Cigarettes.  She has a 16.00 pack-year smoking history. She has never used smokeless tobacco.  Objective Findings:  Vitals: Blood pressure 120/80, pulse 61, height 4\' 2"  (1.27 m), weight 130 lb (59 kg).  Physical Examination: General appearance - alert, well appearing, and in no distress, oriented to person, place, and time and overweight Mental status - alert, oriented to person, place, and time, normal mood, behavior, speech, dress, motor activity, and thought processes Breasts - tenia Pelvic - normal external genitalia VAGINA: good length, slightly shorted.  Uterus and cervix surgically removed.  Rectum: good rectal support, hemoccult negative   Assessment & Plan:   A:  1. Dyspareunia due to penis size/ vaginal length descrepancy.  P:  1. Premarin vaginal cream 2. F/U PRN 3 self exam encouraged. May need partner to be more aware of female anatomy limitation.  By signing my name below, I, [name], attest that this documentation has been prepared under the direction and in the presence of Jonnie Kind, MD. Electronically Signed: [name], Medical Scribe. 10/22/16. 3:30 PM.

## 2016-10-23 ENCOUNTER — Telehealth: Payer: Self-pay | Admitting: Obstetrics and Gynecology

## 2016-10-23 NOTE — Telephone Encounter (Signed)
Patient called stating that she came in yesterday to see Dr.Ferguson and he was suppose to call her in some medication. Pt states that she called her pharmacy and tey state they never received anything. Please contact pt

## 2016-10-24 ENCOUNTER — Other Ambulatory Visit: Payer: Self-pay | Admitting: *Deleted

## 2016-10-24 MED ORDER — ESTROGENS, CONJUGATED 0.625 MG/GM VA CREA: TOPICAL_CREAM | Freq: Every day | VAGINAL | 2 refills | Status: DC

## 2017-06-18 ENCOUNTER — Other Ambulatory Visit: Payer: Self-pay

## 2017-06-18 ENCOUNTER — Encounter (HOSPITAL_COMMUNITY): Payer: Self-pay | Admitting: Emergency Medicine

## 2017-06-18 ENCOUNTER — Emergency Department (HOSPITAL_COMMUNITY): Payer: Medicaid Other

## 2017-06-18 ENCOUNTER — Emergency Department (HOSPITAL_COMMUNITY)
Admission: EM | Admit: 2017-06-18 | Discharge: 2017-06-18 | Disposition: A | Discharge status: Home / Self Care | Payer: Medicaid Other | Attending: Emergency Medicine | Admitting: Emergency Medicine

## 2017-06-18 DIAGNOSIS — F17210 Nicotine dependence, cigarettes, uncomplicated: Secondary | ICD-10-CM | POA: Diagnosis not present

## 2017-06-18 DIAGNOSIS — I10 Essential (primary) hypertension: Secondary | ICD-10-CM | POA: Insufficient documentation

## 2017-06-18 DIAGNOSIS — Y92012 Bathroom of single-family (private) house as the place of occurrence of the external cause: Secondary | ICD-10-CM | POA: Insufficient documentation

## 2017-06-18 DIAGNOSIS — Y9301 Activity, walking, marching and hiking: Secondary | ICD-10-CM | POA: Insufficient documentation

## 2017-06-18 DIAGNOSIS — S93402A Sprain of unspecified ligament of left ankle, initial encounter: Secondary | ICD-10-CM | POA: Diagnosis not present

## 2017-06-18 DIAGNOSIS — M79672 Pain in left foot: Secondary | ICD-10-CM | POA: Insufficient documentation

## 2017-06-18 DIAGNOSIS — X509XXA Other and unspecified overexertion or strenuous movements or postures, initial encounter: Secondary | ICD-10-CM | POA: Diagnosis not present

## 2017-06-18 DIAGNOSIS — E119 Type 2 diabetes mellitus without complications: Secondary | ICD-10-CM | POA: Insufficient documentation

## 2017-06-18 DIAGNOSIS — M25572 Pain in left ankle and joints of left foot: Secondary | ICD-10-CM

## 2017-06-18 DIAGNOSIS — S99912A Unspecified injury of left ankle, initial encounter: Secondary | ICD-10-CM | POA: Diagnosis present

## 2017-06-18 DIAGNOSIS — Z7984 Long term (current) use of oral hypoglycemic drugs: Secondary | ICD-10-CM | POA: Insufficient documentation

## 2017-06-18 DIAGNOSIS — Y999 Unspecified external cause status: Secondary | ICD-10-CM | POA: Insufficient documentation

## 2017-06-18 DIAGNOSIS — Z79899 Other long term (current) drug therapy: Secondary | ICD-10-CM | POA: Insufficient documentation

## 2017-06-18 DIAGNOSIS — J449 Chronic obstructive pulmonary disease, unspecified: Secondary | ICD-10-CM | POA: Diagnosis not present

## 2017-06-18 DIAGNOSIS — E079 Disorder of thyroid, unspecified: Secondary | ICD-10-CM | POA: Diagnosis not present

## 2017-06-18 MED ORDER — HYDROCODONE-ACETAMINOPHEN 5-325 MG PO TABS
1.0000 | ORAL_TABLET | Freq: Once | ORAL | Status: AC
  Administered 2017-06-18: 1 via ORAL
  Filled 2017-06-18: qty 1

## 2017-06-18 MED ORDER — HYDROCODONE-ACETAMINOPHEN 5-325 MG PO TABS: 1.0000 | ORAL_TABLET | Freq: Four times a day (QID) | ORAL | 0 refills | Status: DC | PRN

## 2017-06-18 NOTE — ED Provider Notes (Signed)
[facility] EMERGENCY DEPARTMENT Provider Note   CSN: [id number] Arrival date & time: 06/18/17  1818     History   Chief Complaint Chief Complaint  Patient presents with  . Foot Pain    HPI [name] is a 52 y.o. female.  [name] is a 52 y.o. Female with a history of COPD, GERD, hypertension, migraines, diabetes and chronic low back pain, presents to the emergency department for evaluation of pain and swelling in the left foot.  Patient reports when she woke up this morning her foot was asleep and she was trying to wake it up by walking.  She went to the bathroom, as her foot woke up she noticed pain and realize she was walking on the side of her foot rather than the bottom.  She reports since then she has had worsening pain and swelling over the lateral aspect of the foot and ankle.  No overlying redness or warmth, no fevers or chills.  Patient denies any numbness or tingling now that her foot has woken up, but when she wiggles her toes it since a sharp pain through her foot.  Pain is made worse with weightbearing or palpation.  She has not tried anything for pain prior to arrival.  No prior injury or surgeries to this foot or ankle.     Past Medical History:  Diagnosis Date  . Anxiety   . Chronic lower back pain   . COPD (chronic obstructive pulmonary disease) ([place])   . DDD (degenerative disc disease)   . Depression   . Emphysema lung ([place])   . GERD (gastroesophageal reflux disease)   . High cholesterol   . History of hiatal hernia   . Hypertension   . Migraine    "a few times/month" (04/25/2014)  . Ovarian cancer ([place])   . Pneumonia    "once or twice" (04/25/2014)  . Thyroid disease   . Type II diabetes mellitus [facility])     Patient Active Problem List   Diagnosis Date Noted  . Spondylolisthesis of lumbar region 04/25/2014    Past Surgical History:  Procedure Laterality Date  . ABDOMINAL HYSTERECTOMY  ~ 1997  . BACK SURGERY    . [place]; 1995  . DILATION AND CURETTAGE OF UTERUS    . POSTERIOR LUMBAR FUSION  04/25/2014   L5-S1     OB History    Gravida  2   Para  2   Term  2   Preterm      AB      Living  2     SAB      TAB      Ectopic      Multiple      Live Births               Home Medications    Prior to Admission medications   Medication Sig Start Date End Date Taking? Authorizing Provider  acetaminophen (TYLENOL) 500 MG tablet Take 1,000-3,000 mg by mouth 3 (three) times daily as needed (back pain).     [provider]  albuterol (PROVENTIL HFA;VENTOLIN HFA) 108 (90 BASE) MCG/ACT inhaler Inhale 2 puffs into the lungs every 4 (four) hours as needed for wheezing or shortness of breath. 04/17/11   Charlena Cross, MD  ALPRAZolam Duanne Moron) 0.25 MG tablet Take 1 tablet (0.25 mg total) by mouth at bedtime as needed for anxiety. 02/24/13   Elisha Headland, FNP  aspirin EC 81  MG tablet Take 81 mg by mouth daily.    [provider]  aspirin-acetaminophen-caffeine (EXCEDRIN MIGRAINE) [phone] MG per tablet Take 2 tablets by mouth 2 (two) times daily as needed for headache or migraine.    [provider]  atenolol (TENORMIN) 100 MG tablet Take 100 mg by mouth daily.    [provider]  atenolol-chlorthalidone (TENORETIC) 100-25 MG per tablet Take 1 tablet by mouth daily.    [provider]  azithromycin (ZITHROMAX) 250 MG tablet Take 1 tablet (250 mg total) by mouth daily. Take first 2 tablets together, then 1 every day until finished. 01/02/16   Virgel Manifold, MD  benztropine (COGENTIN) 1 MG tablet Take 1 mg by mouth at bedtime.    [provider]  buprenorphine-naloxone (SUBOXONE) 8-2 mg SUBL SL tablet Place 1 tablet under the tongue daily.    [provider]  ciprofloxacin (CIPRO) 500 MG tablet Take 1 tablet (500 mg total) by mouth 2 (two) times daily. One po bid x 7 days Patient not taking: Reported on 04/21/2014 01/09/14    Milton Ferguson, MD  conjugated estrogens (PREMARIN) vaginal cream Place vaginally at bedtime. 0.5g insert vaginally every night for 2 weeks then twice weekly. 10/24/16   Jonnie Kind, MD  DULoxetine (CYMBALTA) 30 MG capsule Take 30 mg by mouth daily.    [provider]  escitalopram (LEXAPRO) 20 MG tablet Take 20 mg by mouth daily.    [provider]  fish oil-omega-3 fatty acids 1000 MG capsule Take 1 g by mouth 2 (two) times daily.     [provider]  gabapentin (NEURONTIN) 300 MG capsule Take 300 mg by mouth 3 (three) times daily.    [provider]  HYDROcodone-acetaminophen (NORCO/VICODIN) 5-325 MG per tablet Take 1 tablet by mouth every 6 (six) hours as needed for moderate pain. Patient not taking: Reported on 04/21/2014 01/09/14   Milton Ferguson, MD  ipratropium-albuterol (DUONEB) 0.5-2.5 (3) MG/3ML SOLN Take 3 mLs by nebulization 4 (four) times daily.    [provider]  levothyroxine (SYNTHROID, LEVOTHROID) 25 MCG tablet Take 25 mcg by mouth daily before breakfast.     [provider]  lisinopril (PRINIVIL,ZESTRIL) 2.5 MG tablet Take 2.5 mg by mouth daily.    [provider]  lovastatin (MEVACOR) 20 MG tablet Take 20 mg by mouth at bedtime.    [provider]  metFORMIN (GLUCOPHAGE-XR) 500 MG 24 hr tablet Take 500 mg by mouth 2 (two) times daily.    [provider]  predniSONE (DELTASONE) 20 MG tablet Take 2 tablets (40 mg total) by mouth daily. 01/02/16   Virgel Manifold, MD  ranitidine (ZANTAC) 300 MG tablet Take 300 mg by mouth daily.    [provider]    Family History Family History  Problem Relation Age of Onset  . Diabetes Brother   . Hypertension Other   . Diabetes Other   . Thyroid disease Other   . Hyperlipidemia Other     Social History Social History   Tobacco Use  . Smoking status: Current Every Day Smoker    Packs/day: 0.50    Years: 32.00    Pack years: 16.00     Types: Cigarettes  . Smokeless tobacco: Never Used  Substance Use Topics  . Alcohol use: Never    Frequency: Never  . Drug use: No     Allergies   Cephalexin; Flexeril [cyclobenzaprine hcl]; Morphine and related; Ultram [tramadol hcl]; Nsaids; Sulfa antibiotics; and  Codeine   Review of Systems Review of Systems  Constitutional: Negative for chills and fever.  Musculoskeletal: Positive for arthralgias and joint swelling.  Skin: Negative for color change and rash.  Neurological: Negative for weakness and numbness.     Physical Exam Updated Vital Signs BP 129/80 (BP Location: Right Arm)   Pulse 72   Temp 99.5 F (37.5 C) (Oral)   Resp 16   Ht 4\' 2"  (1.27 m)   Wt 64 kg (141 lb)   SpO2 94%   BMI 39.65 kg/m   Physical Exam  Constitutional: She appears well-developed and well-nourished. No distress.  HENT:  Head: Normocephalic and atraumatic.  Eyes: Right eye exhibits no discharge. Left eye exhibits no discharge.  Pulmonary/Chest: Effort normal. No respiratory distress.  Musculoskeletal:  Tenderness to palpation over the lateral aspect of the foot and ankle with swelling, no ecchymosis or overlying redness or warmth, patient is able to wiggle all toes with some discomfort, she is able to dorsi and plantarflex the ankle somewhat.  There is no pain or ecchymosis over the bottom of the foot.  2+ DP and TP pulses, sensation intact throughout.  No pain in the calf or knee.  Neurological: She is alert. Coordination normal.  Skin: Skin is warm and dry. She is not diaphoretic.  Psychiatric: She has a normal mood and affect. Her behavior is normal.  Nursing note and vitals reviewed.    ED Treatments / Results  Labs (all labs ordered are listed, but only abnormal results are displayed) Labs Reviewed - No data to display  EKG None  Radiology Dg Ankle Complete Left  Result Date: 06/18/2017 CLINICAL DATA:  Abnormal gait.  Pain. EXAM: LEFT ANKLE COMPLETE - 3+ VIEW COMPARISON:   None. FINDINGS: There is no evidence of fracture, dislocation, or joint effusion. There is no evidence of arthropathy or other focal bone abnormality. Soft tissues are unremarkable. IMPRESSION: Normal radiographs. Electronically Signed   By: Nelson Chimes M.D.   On: 06/18/2017 20:07   Dg Foot Complete Left  Result Date: 06/18/2017 CLINICAL DATA:  Abnormal gait.  Pain. EXAM: LEFT FOOT - COMPLETE 3+ VIEW COMPARISON:  None. FINDINGS: There is no evidence of fracture or dislocation. There is no evidence of arthropathy or other focal bone abnormality. Soft tissues are unremarkable. IMPRESSION: Normal radiographs. Electronically Signed   By: Nelson Chimes M.D.   On: 06/18/2017 20:07    Procedures Procedures (including critical care time)  Medications Ordered in ED Medications  HYDROcodone-acetaminophen (NORCO/VICODIN) 5-325 MG per tablet 1 tablet (1 tablet Oral Given 06/18/17 1943)     Initial Impression / Assessment and Plan / ED Course  I have reviewed the triage vital signs and the nursing notes.  Pertinent labs & imaging results that were available during my care of the patient were reviewed by me and considered in my medical decision making (see chart for details).  Presents to the emergency department for evaluation of pain and swelling over the left foot and ankle after she noticed she was walking on the side of her foot like trying to wake it up from it being asleep.  Foot is neurovascularly intact with normal sensation throughout.  X-rays show no evidence of acute fracture, ankle mortise is intact.  Will treat with Ace wrap and crutches.  Pain improved here in the ED with Norco, patient is not able to tolerate NSAIDs so will provide small amount of pain medication, encouraged her to use Tylenol as well, and stressed the  importance of ice and elevation.  Patient expresses understanding and is in agreement with plan, she has seen Dr. Luna Glasgow with orthopedics in the past, she will follow-up with  him if that does not improve.  Return precautions discussed.  Final Clinical Impressions(s) / ED Diagnoses   Final diagnoses:  Left foot pain  Acute left ankle pain  Sprain of left ankle, unspecified ligament, initial encounter    ED Discharge Orders        Ordered    HYDROcodone-acetaminophen (NORCO) 5-325 MG tablet  Every 6 hours PRN     06/18/17 2033       Jacqlyn Larsen, PA-C 06/18/17 2042    Daleen Bo, MD 06/19/17 0000

## 2017-06-18 NOTE — ED Triage Notes (Signed)
Patient c/o left foot pain. Patient states this morning she was walking through house when she felt sudden pain in left foot, patient then states she realized she was walking on the side of her foot. Patient states numbness in 4th and 5th toe with swelling. Patient states "when I do move my toes (referring to 4th and 5th toe) it sends sharp, shooting pain through my foot."

## 2017-06-18 NOTE — Discharge Instructions (Addendum)
Please use Ace wrap and crutches.  Elevate foot as much as possible and use ice.  Please use Tylenol for pain, Norco as needed for breakthrough pain, do not drive after using this medication do not combine with alcohol.  If symptoms are not improving in about 5 days please follow-up with your regular doctor or Dr. Luna Glasgow with orthopedics for further evaluation.  Return to the emergency department for significantly worsened pain, swelling, redness or fevers or any other new or concerning symptoms.

## 2017-07-07 IMAGING — DX DG LUMBAR SPINE COMPLETE 4+V
5 series · 5 of 5 positions shown · non-contrast
Comparison: 08/20/2015

CLINICAL DATA: Fall, low back pain

EXAM:
LUMBAR SPINE - COMPLETE 4+ VIEW

[l-spine ap]
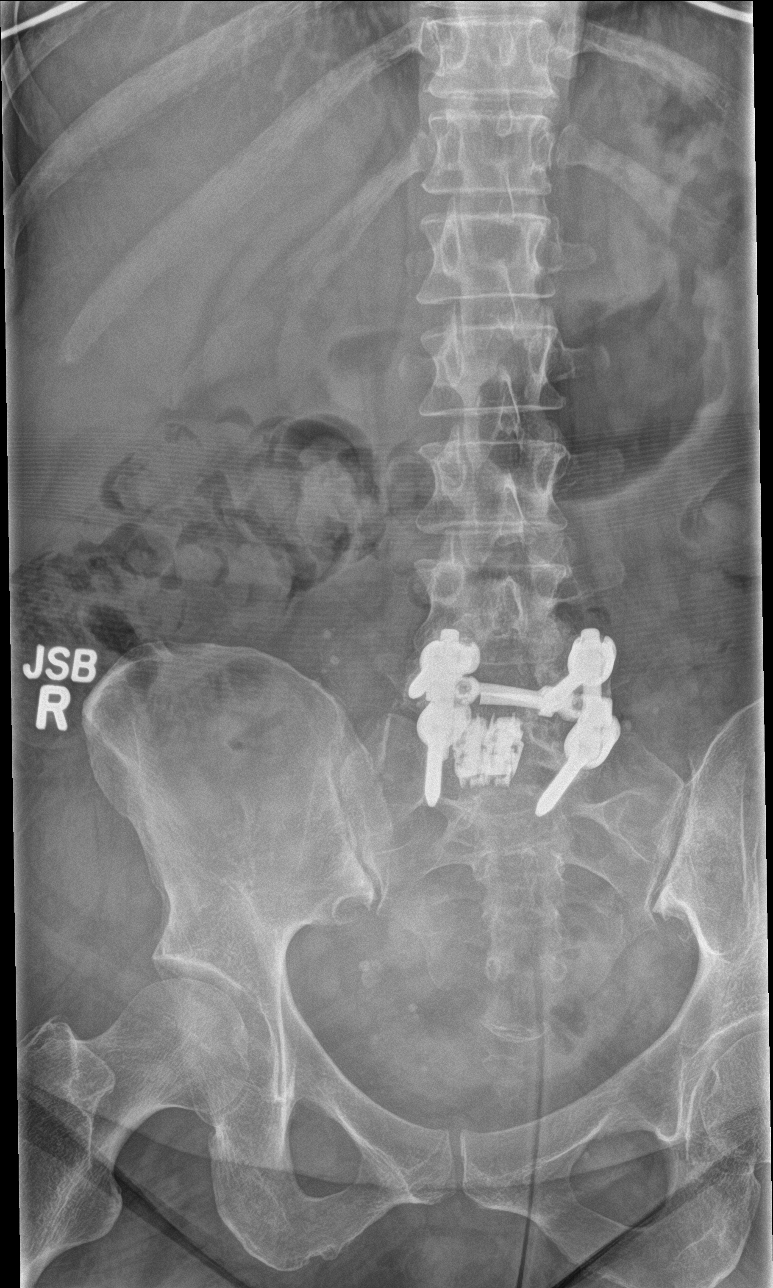

[l-spine obl (1 of 2)]
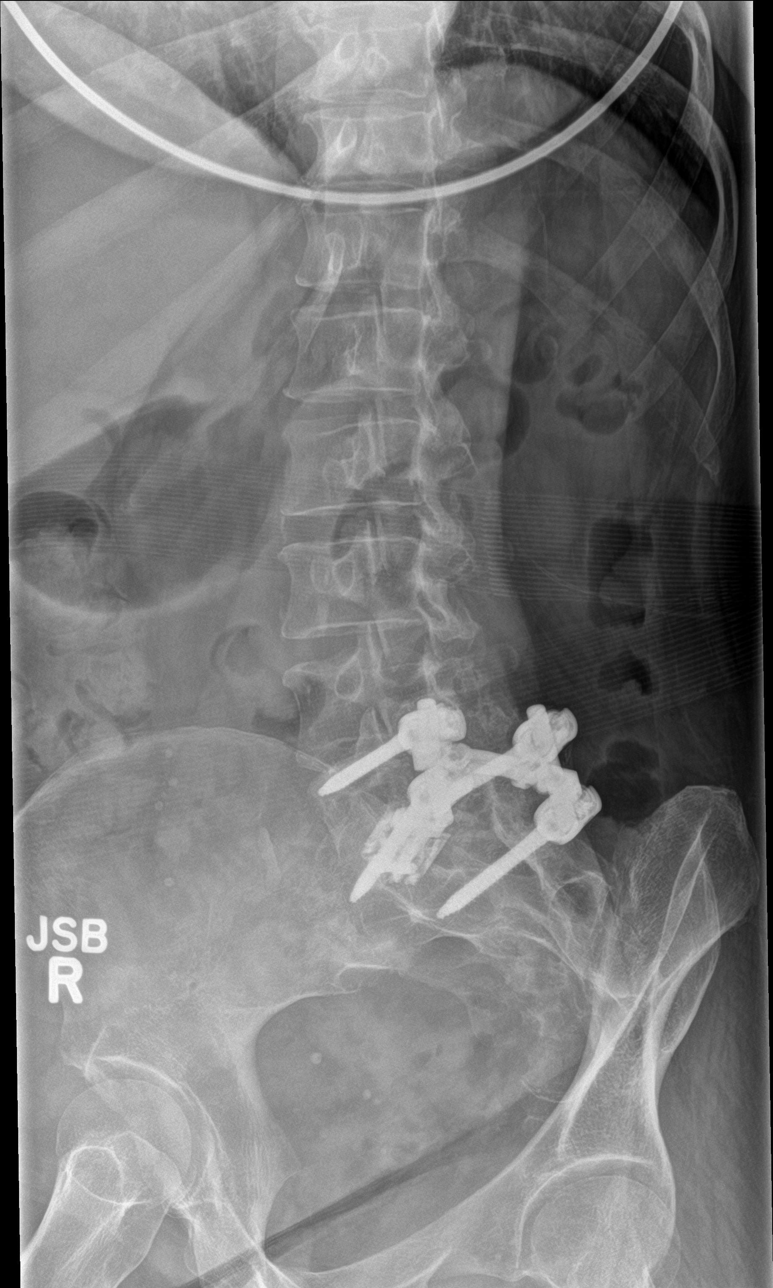

[l-spine obl (2 of 2)]
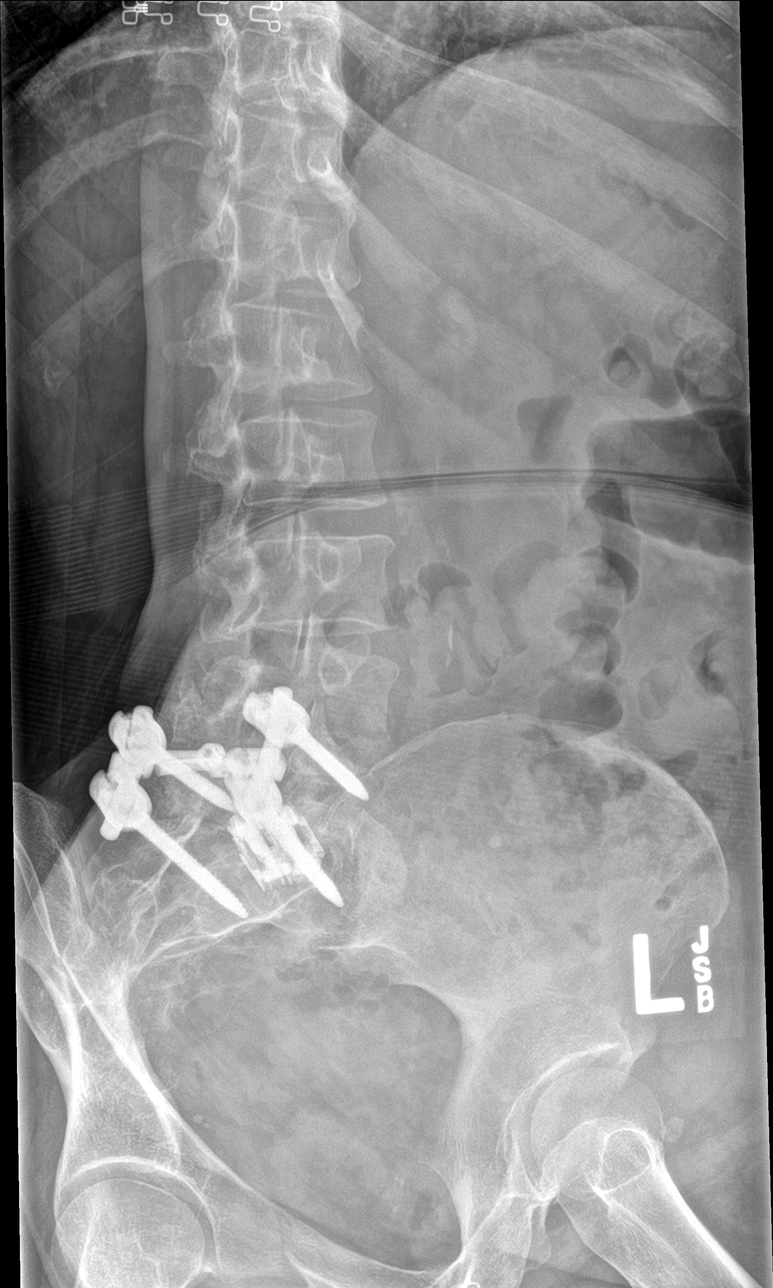

[l-spine lat]
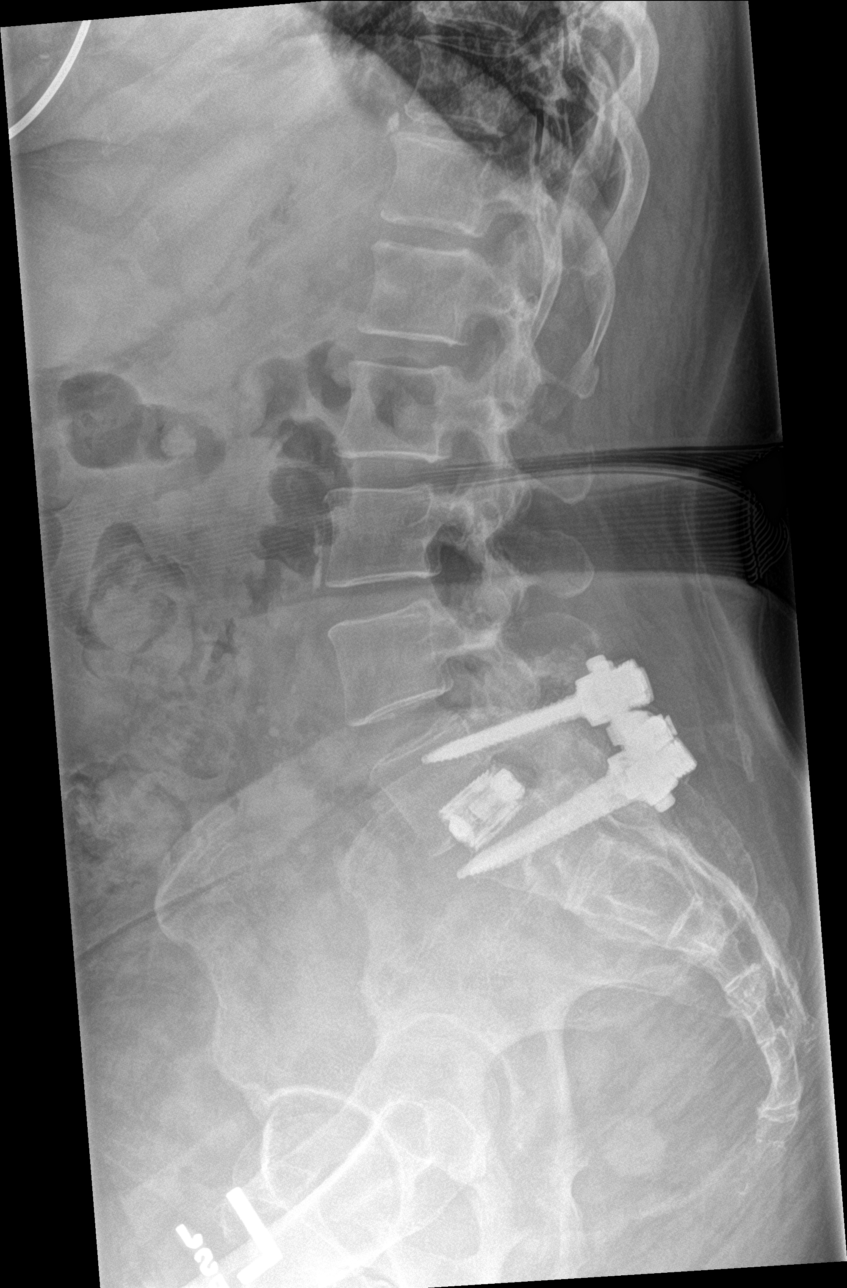

[l-spine spot]
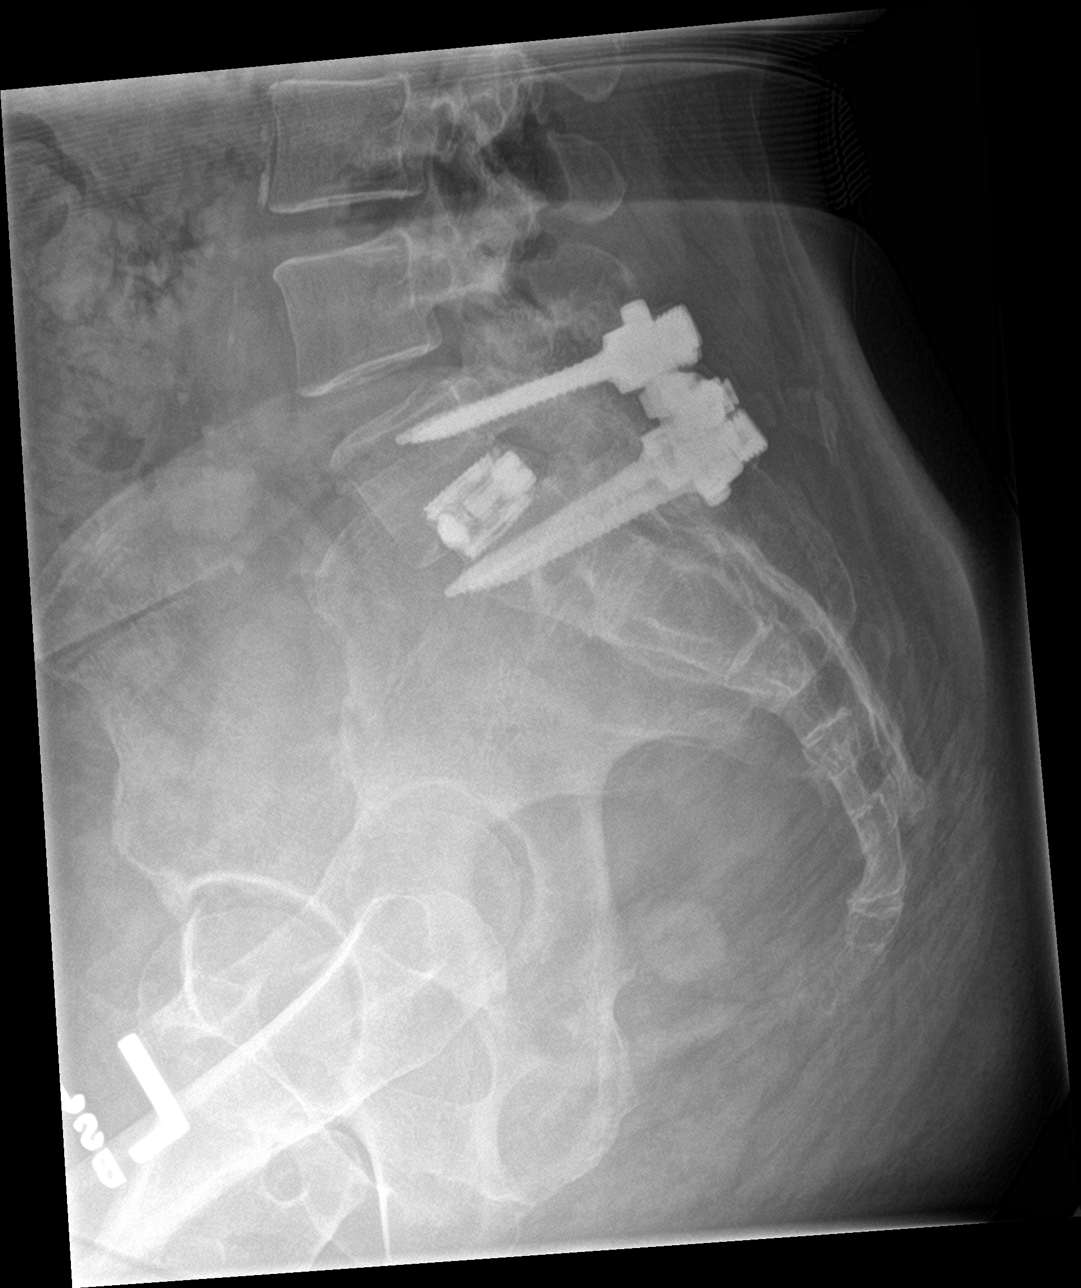

[5 of 5 positions shown; findings below may reference images not displayed]

FINDINGS: Five lumbar type vertebral bodies.

Normal lumbar lordosis.

Status post PLIF at L5-S1, unchanged. No evidence of hardware
loosening or fracture.

No evidence of fracture or dislocation. Vertebral body heights are
maintained.

Visualized bony pelvis appears intact.
IMPRESSION: No fracture or dislocation is seen.

Status post PLIF at L5-S1.  No evidence of hardware complication.

## 2018-03-02 DIAGNOSIS — S52502A Unspecified fracture of the lower end of left radius, initial encounter for closed fracture: Secondary | ICD-10-CM | POA: Insufficient documentation

## 2019-04-29 IMAGING — DX DG FOOT COMPLETE 3+V*L*
3 series · 3 of 3 positions shown · non-contrast
Comparison: None.

CLINICAL DATA: Abnormal gait.  Pain.

EXAM:
LEFT FOOT - COMPLETE 3+ VIEW

[foot ap]
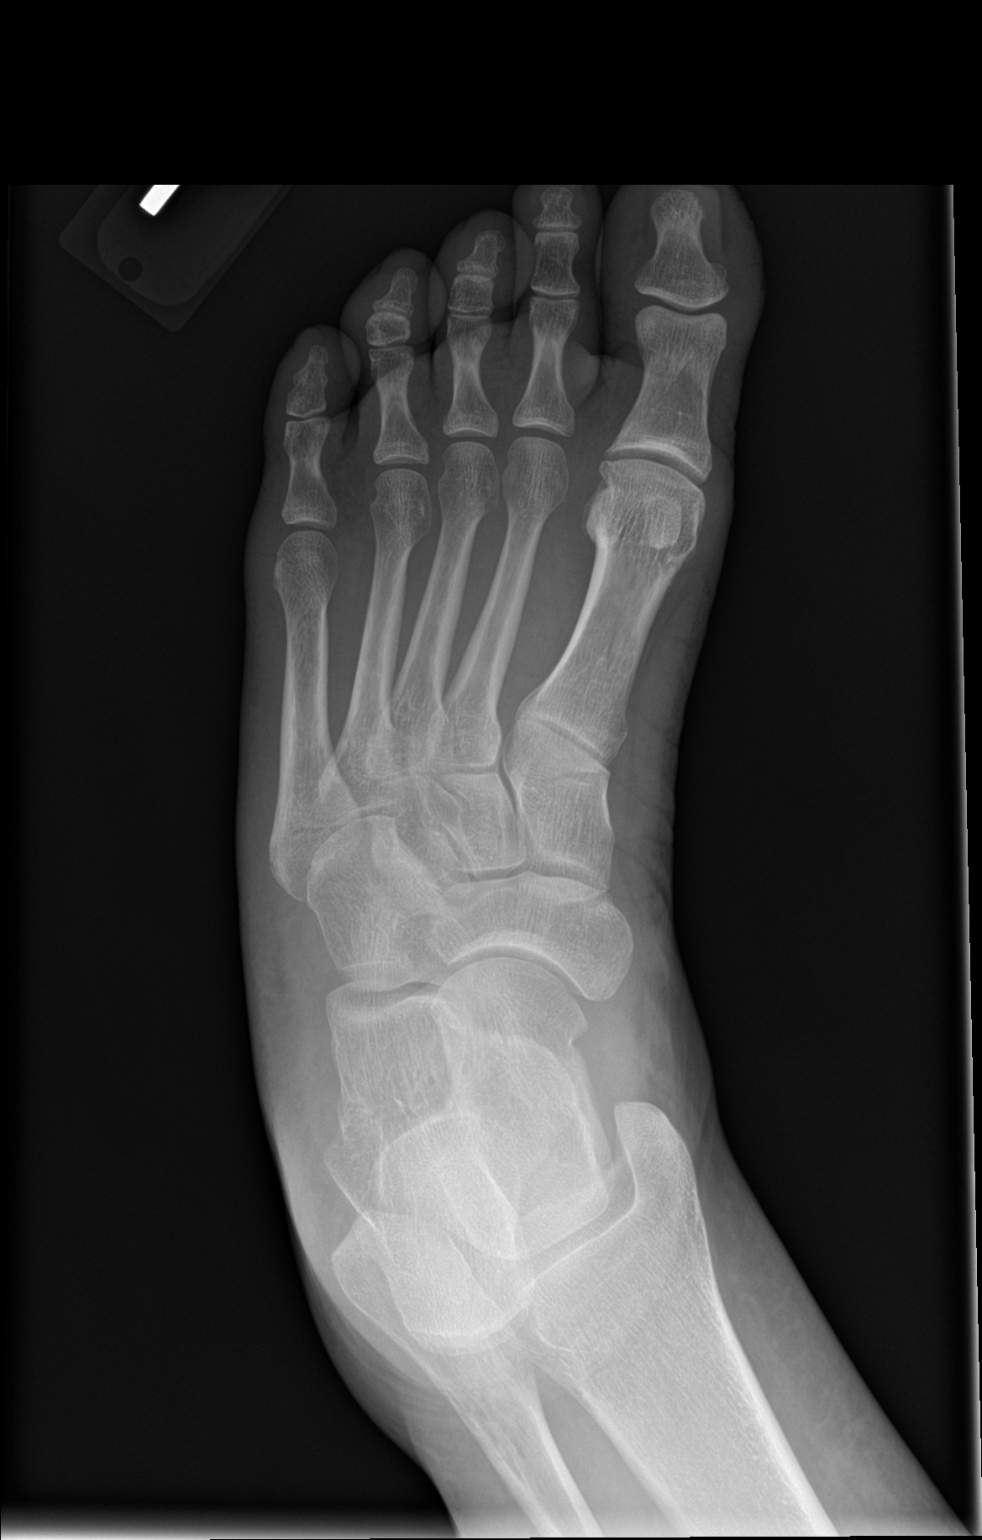

[foot obl]
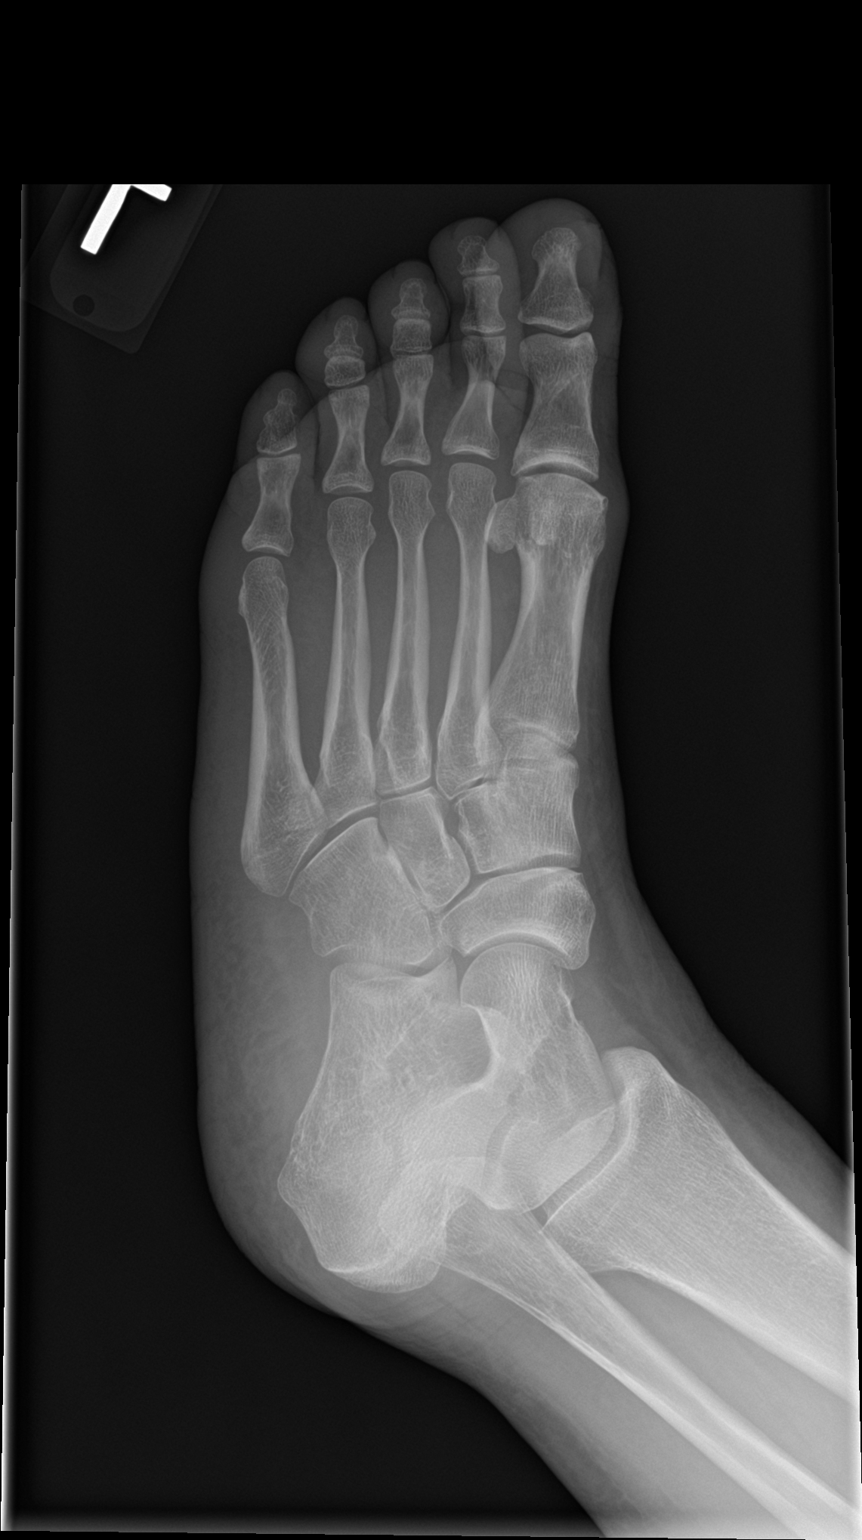

[foot lat]
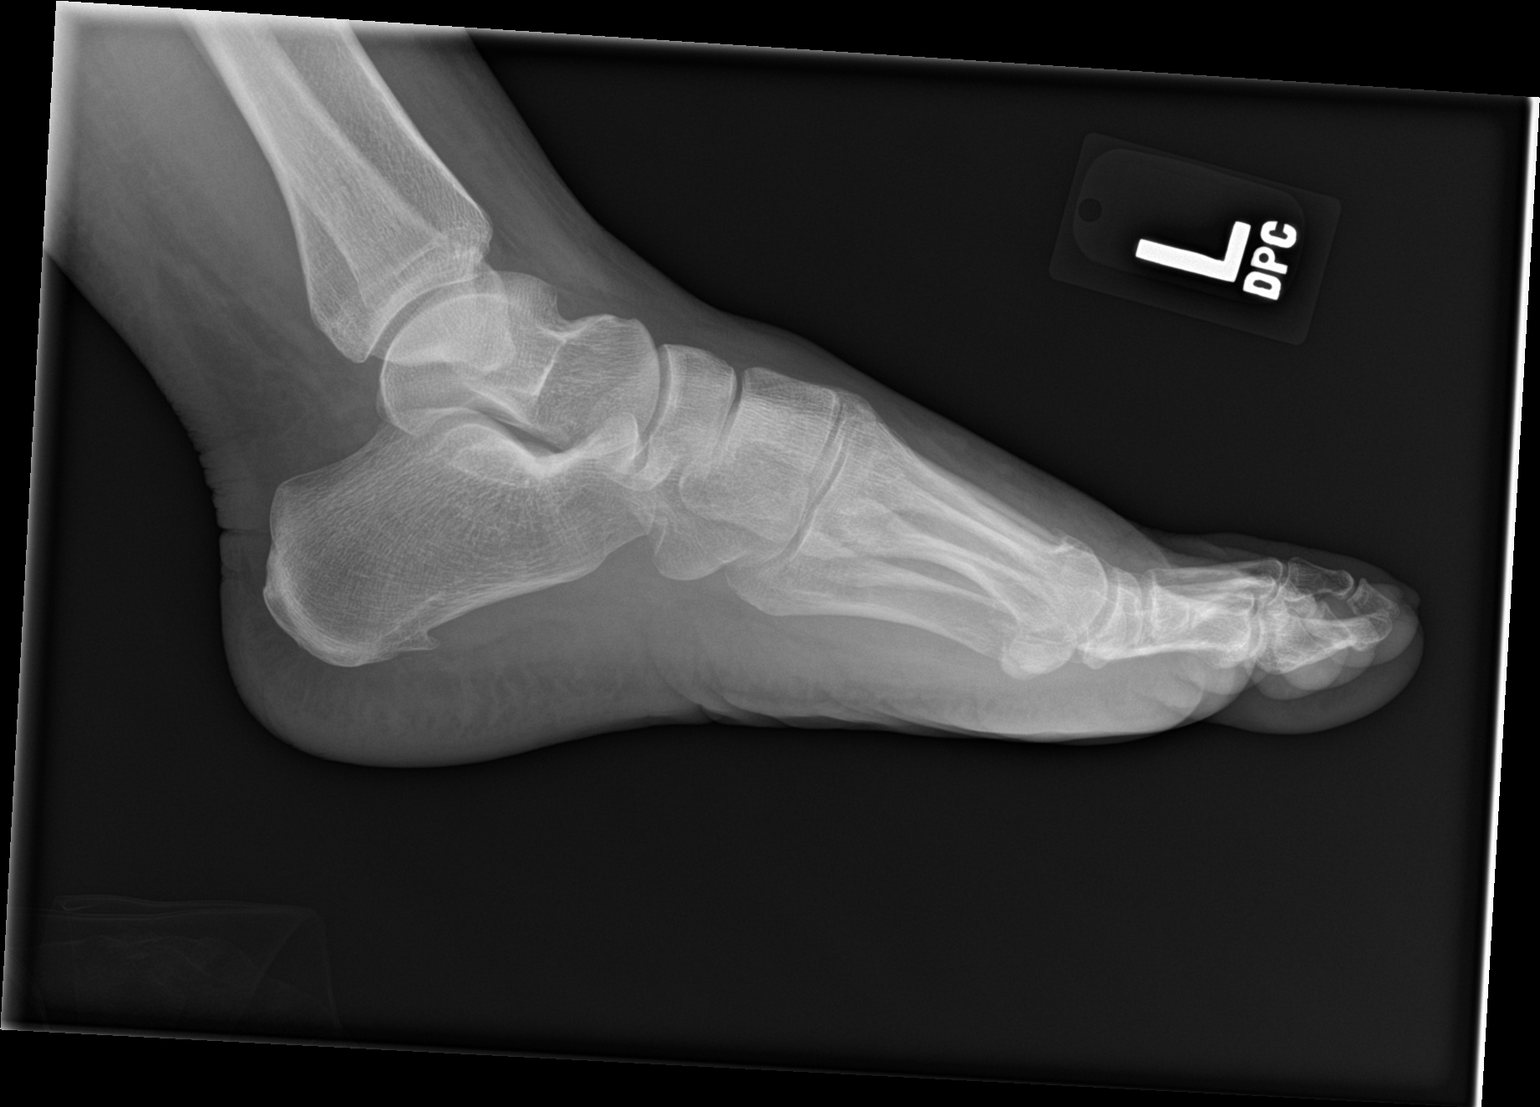

[3 of 3 positions shown; findings below may reference images not displayed]

FINDINGS: There is no evidence of fracture or dislocation. There is no
evidence of arthropathy or other focal bone abnormality. Soft
tissues are unremarkable.
IMPRESSION: Normal radiographs.

## 2020-06-06 ENCOUNTER — Other Ambulatory Visit: Payer: Medicaid Other | Admitting: Adult Health

## 2020-06-27 ENCOUNTER — Encounter (INDEPENDENT_AMBULATORY_CARE_PROVIDER_SITE_OTHER): Payer: Self-pay | Admitting: *Deleted

## 2020-08-18 DIAGNOSIS — K831 Obstruction of bile duct: Secondary | ICD-10-CM | POA: Insufficient documentation

## 2020-08-18 DIAGNOSIS — K838 Other specified diseases of biliary tract: Secondary | ICD-10-CM | POA: Insufficient documentation

## 2020-08-18 DIAGNOSIS — K810 Acute cholecystitis: Secondary | ICD-10-CM | POA: Insufficient documentation

## 2020-09-04 ENCOUNTER — Other Ambulatory Visit: Payer: Self-pay

## 2020-09-04 ENCOUNTER — Encounter: Payer: Self-pay | Admitting: General Surgery

## 2020-09-04 ENCOUNTER — Ambulatory Visit (INDEPENDENT_AMBULATORY_CARE_PROVIDER_SITE_OTHER): Payer: Medicaid Other | Admitting: General Surgery

## 2020-09-04 VITALS — BP 100/69 | HR 67 | Temp 98.2°F | Resp 14 | Ht <= 58 in | Wt 124.0 lb

## 2020-09-04 DIAGNOSIS — K8020 Calculus of gallbladder without cholecystitis without obstruction: Secondary | ICD-10-CM | POA: Diagnosis not present

## 2020-09-05 NOTE — Progress Notes (Signed)
[name]; [id number]; 1965/07/07   HPI Patient is a 55 year old white female who was referred to my care by Dr. Sherrie Sport for evaluation and treatment of cholelithiasis.  Patient initially was seen in the emergency room at [facility].  She was noted to have a dilated hepatobiliary system and ultimately was flown by helicopter to [facility] for further evaluation and treatment.  Records reviewed from that hospitalization revealed the patient had a normal MRCP.  She did have cholelithiasis.  She did not have a cholecystectomy done at that time.  This occurred earlier this month.  She was discharged from [facility] and told to have a cholecystectomy electively.  She currently has intermittent right upper quadrant abdominal pain but it is easing.  She denies any fever, chills, or jaundice.  She has only intermittent nausea.  She has never had an episode like this before. Past Medical History:  Diagnosis Date   Anxiety    Chronic lower back pain    COPD (chronic obstructive pulmonary disease) ([facility])    DDD (degenerative disc disease)    Depression    Emphysema lung ([facility])    GERD (gastroesophageal reflux disease)    High cholesterol    History of hiatal hernia    Hypertension    Migraine    "a few times/month" (04/25/2014)   Ovarian cancer ([place])    Pneumonia    "once or twice" (04/25/2014)   Thyroid disease    Type II diabetes mellitus ([place])     Past Surgical History:  Procedure Laterality Date   ABDOMINAL HYSTERECTOMY  ~ [place]; 1995   DILATION AND CURETTAGE OF UTERUS     POSTERIOR LUMBAR FUSION  04/25/2014   L5-S1    Family History  Problem Relation Age of Onset   Diabetes Brother    Hypertension Other    Diabetes Other    Thyroid disease Other    Hyperlipidemia Other     Current Outpatient Medications on File Prior to Visit  Medication Sig Dispense Refill   acetaminophen (TYLENOL) 500 MG tablet Take 1,000-3,000 mg  by mouth 3 (three) times daily as needed (back pain).      albuterol (PROVENTIL HFA;VENTOLIN HFA) 108 (90 BASE) MCG/ACT inhaler Inhale 2 puffs into the lungs every 4 (four) hours as needed for wheezing or shortness of breath. 1 Inhaler 0   aspirin EC 81 MG tablet Take 81 mg by mouth daily.     aspirin-acetaminophen-caffeine (EXCEDRIN MIGRAINE) 250-250-65 MG per tablet Take 2 tablets by mouth 2 (two) times daily as needed for headache or migraine.     atenolol (TENORMIN) 100 MG tablet Take 100 mg by mouth daily.     buprenorphine-naloxone (SUBOXONE) 8-2 mg SUBL SL tablet Place 1 tablet under the tongue daily.     diazepam (VALIUM) 2 MG tablet Take 2 mg by mouth every 6 (six) hours as needed for anxiety.     DULoxetine (CYMBALTA) 30 MG capsule Take 30 mg by mouth daily.     escitalopram (LEXAPRO) 20 MG tablet Take 20 mg by mouth daily.     famotidine (PEPCID) 20 MG tablet Take 20 mg by mouth daily.     fish oil-omega-3 fatty acids 1000 MG capsule Take 1 g by mouth 2 (two) times daily.      gabapentin (NEURONTIN) 300 MG capsule Take 300 mg by mouth 3 (three) times daily.     ipratropium-albuterol (  DUONEB) 0.5-2.5 (3) MG/3ML SOLN Take 3 mLs by nebulization 4 (four) times daily.     levothyroxine (SYNTHROID, LEVOTHROID) 25 MCG tablet Take 25 mcg by mouth daily before breakfast.      lisinopril (PRINIVIL,ZESTRIL) 2.5 MG tablet Take 2.5 mg by mouth daily.     lovastatin (MEVACOR) 20 MG tablet Take 20 mg by mouth at bedtime.     metFORMIN (GLUCOPHAGE-XR) 500 MG 24 hr tablet Take 500 mg by mouth 2 (two) times daily.     No current facility-administered medications on file prior to visit.    Allergies  Allergen Reactions   Cephalexin Anaphylaxis and Hives   Flexeril [Cyclobenzaprine Hcl] Anaphylaxis   Morphine And Related Anaphylaxis   Ultram [Tramadol Hcl] Anaphylaxis   Nsaids Hives, Swelling and Other (See Comments)    Skin feels hot "burning"   Sulfa Antibiotics Hives and Itching   Codeine  Itching and Rash    Skin feels hot, "burning"    Social History   Substance and Sexual Activity  Alcohol Use Never    Social History   Tobacco Use  Smoking Status Every Day   Packs/day: 0.50   Years: 32.00   Pack years: 16.00   Types: Cigarettes  Smokeless Tobacco Never    Review of Systems  Constitutional:  Positive for chills and malaise/fatigue.  HENT: Negative.    Eyes: Negative.   Respiratory:  Positive for wheezing.   Cardiovascular: Negative.   Gastrointestinal:  Positive for abdominal pain, heartburn and nausea.  Genitourinary:  Positive for urgency.  Musculoskeletal:  Positive for back pain and joint pain.  Skin: Negative.   Neurological: Negative.   Endo/Heme/Allergies: Negative.   Psychiatric/Behavioral: Negative.     Objective   Vitals:   09/04/20 1505  BP: 100/69  Pulse: 67  Resp: 14  Temp: 98.2 F (36.8 C)  SpO2: 96%    Physical Exam Vitals reviewed.  Constitutional:      Appearance: Normal appearance. She is not ill-appearing.  HENT:     Head: Normocephalic and atraumatic.  Cardiovascular:     Rate and Rhythm: Normal rate and regular rhythm.     Heart sounds: Normal heart sounds. No murmur heard.   No friction rub. No gallop.  Pulmonary:     Effort: Pulmonary effort is normal. No respiratory distress.     Breath sounds: Normal breath sounds. No stridor. No wheezing, rhonchi or rales.  Abdominal:     General: Bowel sounds are normal. There is no distension.     Palpations: Abdomen is soft. There is no mass.     Tenderness: There is no abdominal tenderness. There is no guarding or rebound.     Hernia: No hernia is present.     Comments: Discomfort noted in the right upper quadrant to palpation, though she does not have exquisite tenderness or rigidity.  Skin:    General: Skin is warm and dry.  Neurological:     Mental Status: She is alert and oriented to person, place, and time.   Limited medical records from [place] fear  reviewed Assessment  Episode of acute cholecystitis with cholelithiasis.  Patient's symptoms are resolving. Plan  Will get ultrasound of the right upper quadrant to further assess the hepatobiliary tree now.  Further management is pending those results.

## 2020-09-11 ENCOUNTER — Telehealth: Payer: Self-pay | Admitting: Family Medicine

## 2020-09-11 NOTE — Telephone Encounter (Signed)
Pt's daughter called and was requesting pain medication for patient. Per Dr. Arnoldo Morale she is under pain contract and he can not give her any pain medication until post-op.   Tried to call - no answer and no vm

## 2020-09-14 ENCOUNTER — Ambulatory Visit (HOSPITAL_COMMUNITY): Payer: Medicaid Other

## 2020-09-17 ENCOUNTER — Encounter: Payer: Self-pay | Admitting: Family Medicine

## 2020-09-17 DIAGNOSIS — E785 Hyperlipidemia, unspecified: Secondary | ICD-10-CM | POA: Insufficient documentation

## 2020-09-17 DIAGNOSIS — E119 Type 2 diabetes mellitus without complications: Secondary | ICD-10-CM | POA: Insufficient documentation

## 2020-09-17 DIAGNOSIS — E782 Mixed hyperlipidemia: Secondary | ICD-10-CM | POA: Insufficient documentation

## 2020-09-17 DIAGNOSIS — J449 Chronic obstructive pulmonary disease, unspecified: Secondary | ICD-10-CM | POA: Insufficient documentation

## 2020-09-17 DIAGNOSIS — I10 Essential (primary) hypertension: Secondary | ICD-10-CM | POA: Insufficient documentation

## 2020-09-17 NOTE — Telephone Encounter (Signed)
Pt called and states that she missed her appointment as she has short term memory loss. She would like to reschedule this. Also would like some pain medication. I give her the number for central scheduling for her to call and set up an apt to accommodate her schedule. Also informed her that we can not give her any pain medication until she is post-op. Pt verbalized understanding.

## 2020-09-21 ENCOUNTER — Other Ambulatory Visit: Payer: Self-pay

## 2020-09-21 ENCOUNTER — Ambulatory Visit (HOSPITAL_COMMUNITY)
Admission: RE | Admit: 2020-09-21 | Discharge: 2020-09-21 | Disposition: A | Discharge status: Home / Self Care | Payer: Medicaid Other | Source: Ambulatory Visit | Attending: General Surgery | Admitting: General Surgery

## 2020-09-21 DIAGNOSIS — K8020 Calculus of gallbladder without cholecystitis without obstruction: Secondary | ICD-10-CM | POA: Diagnosis present

## 2020-10-25 ENCOUNTER — Encounter (INDEPENDENT_AMBULATORY_CARE_PROVIDER_SITE_OTHER): Payer: Self-pay | Admitting: Gastroenterology

## 2020-10-25 ENCOUNTER — Ambulatory Visit (INDEPENDENT_AMBULATORY_CARE_PROVIDER_SITE_OTHER): Payer: Medicaid Other | Admitting: Gastroenterology

## 2020-10-30 ENCOUNTER — Ambulatory Visit: Payer: Medicaid Other | Admitting: General Surgery

## 2020-12-04 ENCOUNTER — Ambulatory Visit: Payer: Medicaid Other | Admitting: General Surgery

## 2020-12-06 ENCOUNTER — Ambulatory Visit (INDEPENDENT_AMBULATORY_CARE_PROVIDER_SITE_OTHER): Payer: Medicaid Other | Admitting: General Surgery

## 2020-12-06 ENCOUNTER — Other Ambulatory Visit: Payer: Self-pay

## 2020-12-06 ENCOUNTER — Encounter: Payer: Self-pay | Admitting: General Surgery

## 2020-12-06 VITALS — BP 144/90 | HR 99 | Temp 98.6°F | Resp 14 | Ht <= 58 in | Wt 122.0 lb

## 2020-12-06 DIAGNOSIS — R197 Diarrhea, unspecified: Secondary | ICD-10-CM | POA: Diagnosis not present

## 2020-12-07 NOTE — Progress Notes (Signed)
Subjective:     [name]  Patient is here for follow-up of her ultrasound report.  She states that she does have some diarrhea, but no specific right upper quadrant abdominal pain.  She denies any fever, chills, jaundice.  She states her COPD is not well controlled. Objective:    BP (!) 144/90   Pulse 99   Temp 98.6 F (37 C) (Other (Comment))   Resp 14   Ht 4\' 2"  (1.27 m)   Wt 122 lb (55.3 kg)   SpO2 97%   BMI 34.31 kg/m   General:  alert, cooperative, and no distress  Ultrasound shows no cholelithiasis.  Common bile duct is within normal limits.     Assessment:    History of cholecystitis, cholelithiasis, choledocholithiasis.  Currently, her ultrasound is negative for cholelithiasis.  She is not too symptomatic at this time.  Her COPD is flaring up.    Plan:   I told her that I did not think a laparoscopic cholecystectomy was warranted at this time.  She understands and agrees.  She was told to follow-up with me as needed.

## 2020-12-10 ENCOUNTER — Encounter: Payer: Self-pay | Admitting: Internal Medicine

## 2021-02-06 ENCOUNTER — Inpatient Hospital Stay (HOSPITAL_COMMUNITY)
Admission: EM | Admit: 2021-02-06 | Discharge: 2021-02-09 | DRG: 189 | Disposition: A | Discharge status: Home / Self Care | Payer: Medicaid Other | Attending: Family Medicine | Admitting: Family Medicine

## 2021-02-06 ENCOUNTER — Emergency Department (HOSPITAL_COMMUNITY): Payer: Medicaid Other

## 2021-02-06 ENCOUNTER — Encounter (HOSPITAL_COMMUNITY): Payer: Self-pay

## 2021-02-06 ENCOUNTER — Other Ambulatory Visit: Payer: Self-pay

## 2021-02-06 DIAGNOSIS — K219 Gastro-esophageal reflux disease without esophagitis: Secondary | ICD-10-CM | POA: Diagnosis present

## 2021-02-06 DIAGNOSIS — Z885 Allergy status to narcotic agent status: Secondary | ICD-10-CM

## 2021-02-06 DIAGNOSIS — F32A Depression, unspecified: Secondary | ICD-10-CM | POA: Diagnosis present

## 2021-02-06 DIAGNOSIS — E1169 Type 2 diabetes mellitus with other specified complication: Secondary | ICD-10-CM

## 2021-02-06 DIAGNOSIS — Z20822 Contact with and (suspected) exposure to covid-19: Secondary | ICD-10-CM | POA: Diagnosis present

## 2021-02-06 DIAGNOSIS — J439 Emphysema, unspecified: Secondary | ICD-10-CM | POA: Diagnosis present

## 2021-02-06 DIAGNOSIS — J449 Chronic obstructive pulmonary disease, unspecified: Secondary | ICD-10-CM | POA: Diagnosis present

## 2021-02-06 DIAGNOSIS — F17210 Nicotine dependence, cigarettes, uncomplicated: Secondary | ICD-10-CM | POA: Diagnosis present

## 2021-02-06 DIAGNOSIS — Z8249 Family history of ischemic heart disease and other diseases of the circulatory system: Secondary | ICD-10-CM

## 2021-02-06 DIAGNOSIS — E782 Mixed hyperlipidemia: Secondary | ICD-10-CM | POA: Diagnosis present

## 2021-02-06 DIAGNOSIS — Z882 Allergy status to sulfonamides status: Secondary | ICD-10-CM

## 2021-02-06 DIAGNOSIS — Z833 Family history of diabetes mellitus: Secondary | ICD-10-CM

## 2021-02-06 DIAGNOSIS — E7800 Pure hypercholesterolemia, unspecified: Secondary | ICD-10-CM | POA: Diagnosis present

## 2021-02-06 DIAGNOSIS — J9601 Acute respiratory failure with hypoxia: Principal | ICD-10-CM

## 2021-02-06 DIAGNOSIS — Z90710 Acquired absence of both cervix and uterus: Secondary | ICD-10-CM

## 2021-02-06 DIAGNOSIS — Z79890 Hormone replacement therapy (postmenopausal): Secondary | ICD-10-CM

## 2021-02-06 DIAGNOSIS — Z79899 Other long term (current) drug therapy: Secondary | ICD-10-CM

## 2021-02-06 DIAGNOSIS — Z881 Allergy status to other antibiotic agents status: Secondary | ICD-10-CM

## 2021-02-06 DIAGNOSIS — J441 Chronic obstructive pulmonary disease with (acute) exacerbation: Secondary | ICD-10-CM | POA: Diagnosis not present

## 2021-02-06 DIAGNOSIS — Z8543 Personal history of malignant neoplasm of ovary: Secondary | ICD-10-CM

## 2021-02-06 DIAGNOSIS — E785 Hyperlipidemia, unspecified: Secondary | ICD-10-CM | POA: Diagnosis present

## 2021-02-06 DIAGNOSIS — F419 Anxiety disorder, unspecified: Secondary | ICD-10-CM | POA: Diagnosis present

## 2021-02-06 DIAGNOSIS — Z981 Arthrodesis status: Secondary | ICD-10-CM

## 2021-02-06 DIAGNOSIS — Z720 Tobacco use: Secondary | ICD-10-CM

## 2021-02-06 DIAGNOSIS — K029 Dental caries, unspecified: Secondary | ICD-10-CM | POA: Diagnosis present

## 2021-02-06 DIAGNOSIS — E1165 Type 2 diabetes mellitus with hyperglycemia: Secondary | ICD-10-CM | POA: Diagnosis present

## 2021-02-06 DIAGNOSIS — Z7984 Long term (current) use of oral hypoglycemic drugs: Secondary | ICD-10-CM

## 2021-02-06 DIAGNOSIS — M545 Low back pain, unspecified: Secondary | ICD-10-CM | POA: Diagnosis present

## 2021-02-06 DIAGNOSIS — Z792 Long term (current) use of antibiotics: Secondary | ICD-10-CM

## 2021-02-06 DIAGNOSIS — G8929 Other chronic pain: Secondary | ICD-10-CM | POA: Diagnosis present

## 2021-02-06 DIAGNOSIS — E119 Type 2 diabetes mellitus without complications: Secondary | ICD-10-CM

## 2021-02-06 DIAGNOSIS — I10 Essential (primary) hypertension: Secondary | ICD-10-CM | POA: Diagnosis present

## 2021-02-06 DIAGNOSIS — G43909 Migraine, unspecified, not intractable, without status migrainosus: Secondary | ICD-10-CM | POA: Diagnosis present

## 2021-02-06 LAB — CBC WITH DIFFERENTIAL/PLATELET
Abs Immature Granulocytes: 0.02 10*3/uL (ref 0.00–0.07)
Basophils Absolute: 0 10*3/uL (ref 0.0–0.1)
Basophils Relative: 1 %
Eosinophils Absolute: 0.1 10*3/uL (ref 0.0–0.5)
Eosinophils Relative: 1 %
HCT: 42.7 % (ref 36.0–46.0)
Hemoglobin: 13.5 g/dL (ref 12.0–15.0)
Immature Granulocytes: 0 %
Lymphocytes Relative: 36 %
Lymphs Abs: 2.5 10*3/uL (ref 0.7–4.0)
MCH: 31 pg (ref 26.0–34.0)
MCHC: 31.6 g/dL (ref 30.0–36.0)
MCV: 98.2 fL (ref 80.0–100.0)
Monocytes Absolute: 0.6 10*3/uL (ref 0.1–1.0)
Monocytes Relative: 8 %
Neutro Abs: 3.7 10*3/uL (ref 1.7–7.7)
Neutrophils Relative %: 54 %
Platelets: 367 10*3/uL (ref 150–400)
RBC: 4.35 MIL/uL (ref 3.87–5.11)
RDW: 13 % (ref 11.5–15.5)
WBC: 6.9 10*3/uL (ref 4.0–10.5)
nRBC: 0 % (ref 0.0–0.2)

## 2021-02-06 LAB — BLOOD GAS, ARTERIAL
Acid-Base Excess: 8.1 mmol/L — ABNORMAL HIGH (ref 0.0–2.0)
Bicarbonate: 30.5 mmol/L — ABNORMAL HIGH (ref 20.0–28.0)
Drawn by: 38235
FIO2: 30
O2 Saturation: 94.7 %
Patient temperature: 37
pCO2 arterial: 58.9 mmHg — ABNORMAL HIGH (ref 32.0–48.0)
pH, Arterial: 7.372 (ref 7.350–7.450)
pO2, Arterial: 81.9 mmHg — ABNORMAL LOW (ref 83.0–108.0)

## 2021-02-06 LAB — RESP PANEL BY RT-PCR (FLU A&B, COVID) ARPGX2
Influenza A by PCR: NEGATIVE
Influenza B by PCR: NEGATIVE
SARS Coronavirus 2 by RT PCR: NEGATIVE

## 2021-02-06 LAB — BASIC METABOLIC PANEL
Anion gap: 7 (ref 5–15)
BUN: 17 mg/dL (ref 6–20)
CO2: 32 mmol/L (ref 22–32)
Calcium: 8.6 mg/dL — ABNORMAL LOW (ref 8.9–10.3)
Chloride: 94 mmol/L — ABNORMAL LOW (ref 98–111)
Creatinine, Ser: 0.74 mg/dL (ref 0.44–1.00)
GFR, Estimated: >60 mL/min (ref >60)
Glucose, Bld: 157 mg/dL — ABNORMAL HIGH (ref 70–99)
Potassium: 3.4 mmol/L — ABNORMAL LOW (ref 3.5–5.1)
Sodium: 133 mmol/L — ABNORMAL LOW (ref 135–145)

## 2021-02-06 LAB — GLUCOSE, CAPILLARY: Glucose-Capillary: 287 mg/dL — ABNORMAL HIGH (ref 70–99)

## 2021-02-06 MED ORDER — DEXAMETHASONE SODIUM PHOSPHATE 4 MG/ML IJ SOLN
1.0000 mg | Freq: Once | INTRAMUSCULAR | Status: AC
  Administered 2021-02-06: 1 mg via INTRAVENOUS
  Filled 2021-02-06: qty 1

## 2021-02-06 MED ORDER — ALBUTEROL SULFATE (2.5 MG/3ML) 0.083% IN NEBU
5.0000 mg | INHALATION_SOLUTION | Freq: Once | RESPIRATORY_TRACT | Status: AC
Start: 2021-02-06 — End: 2021-02-06
  Administered 2021-02-06: 5 mg via RESPIRATORY_TRACT

## 2021-02-06 MED ORDER — ALBUTEROL SULFATE (2.5 MG/3ML) 0.083% IN NEBU
INHALATION_SOLUTION | RESPIRATORY_TRACT | Status: AC
  Filled 2021-02-06: qty 12

## 2021-02-06 MED ORDER — BENZONATATE 100 MG PO CAPS
200.0000 mg | ORAL_CAPSULE | Freq: Once | ORAL | Status: AC
Start: 2021-02-06 — End: 2021-02-06
  Administered 2021-02-06: 200 mg via ORAL
  Filled 2021-02-06: qty 2

## 2021-02-06 MED ORDER — NICOTINE 21 MG/24HR TD PT24
21.0000 mg | MEDICATED_PATCH | Freq: Every day | TRANSDERMAL | Status: DC
  Administered 2021-02-06 – 2021-02-09 (×4): 21 mg via TRANSDERMAL
  Filled 2021-02-06 (×4): qty 1

## 2021-02-06 MED ORDER — ACETAMINOPHEN 650 MG RE SUPP: 650.0000 mg | Freq: Four times a day (QID) | RECTAL | Status: DC | PRN

## 2021-02-06 MED ORDER — ACETAMINOPHEN 325 MG PO TABS
650.0000 mg | ORAL_TABLET | Freq: Four times a day (QID) | ORAL | Status: DC | PRN
  Administered 2021-02-07 – 2021-02-09 (×3): 650 mg via ORAL
  Filled 2021-02-06 (×4): qty 2

## 2021-02-06 MED ORDER — IPRATROPIUM-ALBUTEROL 0.5-2.5 (3) MG/3ML IN SOLN
RESPIRATORY_TRACT | Status: AC
  Filled 2021-02-06: qty 3

## 2021-02-06 MED ORDER — ALBUTEROL SULFATE (2.5 MG/3ML) 0.083% IN NEBU: 2.5000 mg | INHALATION_SOLUTION | RESPIRATORY_TRACT | Status: DC | PRN

## 2021-02-06 MED ORDER — ALBUTEROL SULFATE (2.5 MG/3ML) 0.083% IN NEBU
2.5000 mg | INHALATION_SOLUTION | Freq: Once | RESPIRATORY_TRACT | Status: DC
  Filled 2021-02-06: qty 3

## 2021-02-06 MED ORDER — PRAVASTATIN SODIUM 10 MG PO TABS
10.0000 mg | ORAL_TABLET | Freq: Every day | ORAL | Status: DC
  Administered 2021-02-07 – 2021-02-08 (×2): 10 mg via ORAL
  Filled 2021-02-06 (×2): qty 1

## 2021-02-06 MED ORDER — IPRATROPIUM-ALBUTEROL 0.5-2.5 (3) MG/3ML IN SOLN
3.0000 mL | Freq: Four times a day (QID) | RESPIRATORY_TRACT | Status: DC
  Administered 2021-02-06 – 2021-02-08 (×6): 3 mL via RESPIRATORY_TRACT
  Filled 2021-02-06 (×4): qty 3

## 2021-02-06 MED ORDER — LEVOTHYROXINE SODIUM 25 MCG PO TABS
25.0000 ug | ORAL_TABLET | Freq: Every day | ORAL | Status: DC
  Administered 2021-02-07 – 2021-02-09 (×3): 25 ug via ORAL
  Filled 2021-02-06 (×3): qty 1

## 2021-02-06 MED ORDER — ASPIRIN EC 81 MG PO TBEC
81.0000 mg | DELAYED_RELEASE_TABLET | Freq: Every day | ORAL | Status: DC
  Administered 2021-02-07 – 2021-02-09 (×3): 81 mg via ORAL
  Filled 2021-02-06 (×3): qty 1

## 2021-02-06 MED ORDER — INSULIN ASPART 100 UNIT/ML IJ SOLN
0.0000 [IU] | Freq: Every day | INTRAMUSCULAR | Status: DC
  Administered 2021-02-06: 3 [IU] via SUBCUTANEOUS

## 2021-02-06 MED ORDER — DEXAMETHASONE SODIUM PHOSPHATE 4 MG/ML IJ SOLN
4.0000 mg | Freq: Three times a day (TID) | INTRAMUSCULAR | Status: DC
  Administered 2021-02-06 – 2021-02-08 (×5): 4 mg via INTRAVENOUS
  Filled 2021-02-06 (×5): qty 1

## 2021-02-06 MED ORDER — FAMOTIDINE 20 MG PO TABS
20.0000 mg | ORAL_TABLET | Freq: Every day | ORAL | Status: DC
  Administered 2021-02-07 – 2021-02-09 (×3): 20 mg via ORAL
  Filled 2021-02-06 (×3): qty 1

## 2021-02-06 MED ORDER — SODIUM CHLORIDE 0.9 % IV BOLUS
500.0000 mL | Freq: Once | INTRAVENOUS | Status: AC
  Administered 2021-02-06: 500 mL via INTRAVENOUS

## 2021-02-06 MED ORDER — DIAZEPAM 2 MG PO TABS
2.0000 mg | ORAL_TABLET | Freq: Four times a day (QID) | ORAL | Status: DC | PRN
  Administered 2021-02-08: 2 mg via ORAL
  Filled 2021-02-06: qty 1

## 2021-02-06 MED ORDER — ATENOLOL 25 MG PO TABS
100.0000 mg | ORAL_TABLET | Freq: Every day | ORAL | Status: DC
  Administered 2021-02-07 – 2021-02-09 (×3): 100 mg via ORAL
  Filled 2021-02-06 (×3): qty 4

## 2021-02-06 MED ORDER — ALBUTEROL (5 MG/ML) CONTINUOUS INHALATION SOLN
10.0000 mg/h | INHALATION_SOLUTION | Freq: Once | RESPIRATORY_TRACT | Status: AC
  Administered 2021-02-06: 10 mg/h via RESPIRATORY_TRACT

## 2021-02-06 MED ORDER — INSULIN ASPART 100 UNIT/ML IJ SOLN
0.0000 [IU] | Freq: Three times a day (TID) | INTRAMUSCULAR | Status: DC
  Administered 2021-02-07: 3 [IU] via SUBCUTANEOUS
  Administered 2021-02-07: 5 [IU] via SUBCUTANEOUS
  Administered 2021-02-08: 8 [IU] via SUBCUTANEOUS
  Administered 2021-02-08: 5 [IU] via SUBCUTANEOUS
  Administered 2021-02-09: 3 [IU] via SUBCUTANEOUS

## 2021-02-06 MED ORDER — LISINOPRIL 5 MG PO TABS
2.5000 mg | ORAL_TABLET | Freq: Every day | ORAL | Status: DC
  Administered 2021-02-07 – 2021-02-09 (×3): 2.5 mg via ORAL
  Filled 2021-02-06 (×3): qty 1

## 2021-02-06 MED ORDER — ENOXAPARIN SODIUM 40 MG/0.4ML IJ SOSY
40.0000 mg | PREFILLED_SYRINGE | INTRAMUSCULAR | Status: DC
  Administered 2021-02-07 – 2021-02-09 (×3): 40 mg via SUBCUTANEOUS
  Filled 2021-02-06 (×3): qty 0.4

## 2021-02-06 MED ORDER — DULOXETINE HCL 30 MG PO CPEP
30.0000 mg | ORAL_CAPSULE | Freq: Every day | ORAL | Status: DC
  Administered 2021-02-07 – 2021-02-09 (×3): 30 mg via ORAL
  Filled 2021-02-06 (×3): qty 1

## 2021-02-06 NOTE — Progress Notes (Signed)
Patient tolerated low dose of IV decdron, she will be started on 4 mg IV Q8H for COOD exacerbation. Phillips Climes MD
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
## 2021-02-06 NOTE — ED Notes (Signed)
Patient is ready for transport.  
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
## 2021-02-06 NOTE — ED Notes (Signed)
Report given to [place], [place]

## 2021-02-06 NOTE — ED Triage Notes (Signed)
Patient via EMS from Urgent Care for shortness of breath. EMS gave albuterol treatment with improvement.

## 2021-02-07 DIAGNOSIS — E7800 Pure hypercholesterolemia, unspecified: Secondary | ICD-10-CM | POA: Diagnosis present

## 2021-02-07 DIAGNOSIS — M545 Low back pain, unspecified: Secondary | ICD-10-CM | POA: Diagnosis present

## 2021-02-07 DIAGNOSIS — J9601 Acute respiratory failure with hypoxia: Secondary | ICD-10-CM | POA: Diagnosis present

## 2021-02-07 DIAGNOSIS — Z7984 Long term (current) use of oral hypoglycemic drugs: Secondary | ICD-10-CM | POA: Diagnosis not present

## 2021-02-07 DIAGNOSIS — J441 Chronic obstructive pulmonary disease with (acute) exacerbation: Secondary | ICD-10-CM | POA: Diagnosis present

## 2021-02-07 DIAGNOSIS — Z79890 Hormone replacement therapy (postmenopausal): Secondary | ICD-10-CM | POA: Diagnosis not present

## 2021-02-07 DIAGNOSIS — F32A Depression, unspecified: Secondary | ICD-10-CM | POA: Diagnosis present

## 2021-02-07 DIAGNOSIS — E1165 Type 2 diabetes mellitus with hyperglycemia: Secondary | ICD-10-CM | POA: Diagnosis present

## 2021-02-07 DIAGNOSIS — F17210 Nicotine dependence, cigarettes, uncomplicated: Secondary | ICD-10-CM | POA: Diagnosis present

## 2021-02-07 DIAGNOSIS — Z792 Long term (current) use of antibiotics: Secondary | ICD-10-CM | POA: Diagnosis not present

## 2021-02-07 DIAGNOSIS — Z881 Allergy status to other antibiotic agents status: Secondary | ICD-10-CM | POA: Diagnosis not present

## 2021-02-07 DIAGNOSIS — K219 Gastro-esophageal reflux disease without esophagitis: Secondary | ICD-10-CM | POA: Diagnosis present

## 2021-02-07 DIAGNOSIS — Z882 Allergy status to sulfonamides status: Secondary | ICD-10-CM | POA: Diagnosis not present

## 2021-02-07 DIAGNOSIS — Z981 Arthrodesis status: Secondary | ICD-10-CM | POA: Diagnosis not present

## 2021-02-07 DIAGNOSIS — Z885 Allergy status to narcotic agent status: Secondary | ICD-10-CM | POA: Diagnosis not present

## 2021-02-07 DIAGNOSIS — R0902 Hypoxemia: Secondary | ICD-10-CM | POA: Insufficient documentation

## 2021-02-07 DIAGNOSIS — Z79899 Other long term (current) drug therapy: Secondary | ICD-10-CM | POA: Diagnosis not present

## 2021-02-07 DIAGNOSIS — Z8543 Personal history of malignant neoplasm of ovary: Secondary | ICD-10-CM | POA: Diagnosis not present

## 2021-02-07 DIAGNOSIS — Z20822 Contact with and (suspected) exposure to covid-19: Secondary | ICD-10-CM | POA: Diagnosis present

## 2021-02-07 DIAGNOSIS — I10 Essential (primary) hypertension: Secondary | ICD-10-CM | POA: Diagnosis present

## 2021-02-07 DIAGNOSIS — J439 Emphysema, unspecified: Secondary | ICD-10-CM | POA: Diagnosis present

## 2021-02-07 DIAGNOSIS — Z90710 Acquired absence of both cervix and uterus: Secondary | ICD-10-CM | POA: Diagnosis not present

## 2021-02-07 DIAGNOSIS — G8929 Other chronic pain: Secondary | ICD-10-CM | POA: Diagnosis present

## 2021-02-07 DIAGNOSIS — F419 Anxiety disorder, unspecified: Secondary | ICD-10-CM | POA: Diagnosis present

## 2021-02-07 DIAGNOSIS — G43909 Migraine, unspecified, not intractable, without status migrainosus: Secondary | ICD-10-CM | POA: Diagnosis present

## 2021-02-07 DIAGNOSIS — K029 Dental caries, unspecified: Secondary | ICD-10-CM | POA: Diagnosis present

## 2021-02-07 LAB — BASIC METABOLIC PANEL
Anion gap: 13 (ref 5–15)
BUN: 15 mg/dL (ref 6–20)
CO2: 31 mmol/L (ref 22–32)
Calcium: 8.8 mg/dL — ABNORMAL LOW (ref 8.9–10.3)
Chloride: 94 mmol/L — ABNORMAL LOW (ref 98–111)
Creatinine, Ser: 0.61 mg/dL (ref 0.44–1.00)
GFR, Estimated: >60 mL/min (ref >60)
Glucose, Bld: 197 mg/dL — ABNORMAL HIGH (ref 70–99)
Potassium: 4 mmol/L (ref 3.5–5.1)
Sodium: 138 mmol/L (ref 135–145)

## 2021-02-07 LAB — CBC
HCT: 42.2 % (ref 36.0–46.0)
Hemoglobin: 13 g/dL (ref 12.0–15.0)
MCH: 29.9 pg (ref 26.0–34.0)
MCHC: 30.8 g/dL (ref 30.0–36.0)
MCV: 97 fL (ref 80.0–100.0)
Platelets: 326 10*3/uL (ref 150–400)
RBC: 4.35 MIL/uL (ref 3.87–5.11)
RDW: 12.9 % (ref 11.5–15.5)
WBC: 4.8 10*3/uL (ref 4.0–10.5)
nRBC: 0 % (ref 0.0–0.2)

## 2021-02-07 LAB — GLUCOSE, CAPILLARY
Glucose-Capillary: 202 mg/dL — ABNORMAL HIGH (ref 70–99)
Glucose-Capillary: 154 mg/dL — ABNORMAL HIGH (ref 70–99)
Glucose-Capillary: 106 mg/dL — ABNORMAL HIGH (ref 70–99)
Glucose-Capillary: 136 mg/dL — ABNORMAL HIGH (ref 70–99)

## 2021-02-07 LAB — HIV ANTIBODY (ROUTINE TESTING W REFLEX): HIV Screen 4th Generation wRfx: NONREACTIVE

## 2021-02-07 MED ORDER — ORAL CARE MOUTH RINSE
15.0000 mL | Freq: Two times a day (BID) | OROMUCOSAL | Status: DC
  Administered 2021-02-07 – 2021-02-09 (×5): 15 mL via OROMUCOSAL

## 2021-02-07 MED ORDER — GUAIFENESIN-DM 100-10 MG/5ML PO SYRP
10.0000 mL | ORAL_SOLUTION | Freq: Three times a day (TID) | ORAL | Status: DC
  Administered 2021-02-07 – 2021-02-09 (×6): 10 mL via ORAL
  Filled 2021-02-07 (×6): qty 10

## 2021-02-07 NOTE — Assessment & Plan Note (Addendum)
-   Anticipate hyperglycemia due to IV steroid use -Resuming home medication of metformin -Strict diabetic diet, -Continue checking CBG q. ACH S with SSI coverage

## 2021-02-07 NOTE — Assessment & Plan Note (Addendum)
-  Look to demand has improved from 5 L down to 2 L, shortness of breath improving, continue to have mild wheezing>>> has been weaned off to room air, currently satting greater 90% -Stating she is not O2 dependent at home -continue double IV steroids>>> Decadron movement switched to p.o. with gradual taper Antibiotics switched to p.o. clindamycin to also cover oral dental cavities,  bronchodilator treatment scheduled-patient was prescribed Dulera and albuterol inhalers Instructed to follow-up with primary care and pulmonologist within next 2 weeks  -ABG on arrival on 2 L of oxygen 7.37/PCO2 58.9/PO2 81.9 -Instructed to quit smoking continue pulmonary toiletry, spirometer at home

## 2021-02-07 NOTE — Assessment & Plan Note (Signed)
-  Continue statins ?

## 2021-02-07 NOTE — Assessment & Plan Note (Signed)
-   Management as above, -Consulted regarding smoking cessation -May need home O2 upon discharge -We will need follow-up outpatient with pulmonologist -Inhaled bronchodilators will be provided upon discharge

## 2021-02-07 NOTE — Assessment & Plan Note (Addendum)
-   Patient states she has been smoking since age of 11 now she is 22, -She was advised strongly regarding smoking cessation she has accepted NicoDerm patch -Prescription provided

## 2021-02-07 NOTE — Progress Notes (Signed)
Had to place patient on HFNC due to low oxygen level. Patient oxygen saturation went down to 84% on 3L. Placed patient on 5L HFNC patients oxygen level went up to 95%. Will continue to monitor.

## 2021-02-07 NOTE — Progress Notes (Signed)
Inpatient Diabetes Program Recommendations  AACE/ADA: New Consensus Statement on Inpatient Glycemic Control   Target Ranges:  Prepandial:   less than 140 mg/dL      Peak postprandial:   less than 180 mg/dL (1-2 hours)      Critically ill patients:  140 - 180 mg/dL    Latest Reference Range & Units 02/06/21 20:51 02/07/21 07:42  Glucose-Capillary 70 - 99 mg/dL 287 (H) 202 (H)   Review of Glycemic Control  Diabetes history: DM2 Outpatient Diabetes medications: Metformin 500 mg BID Current orders for Inpatient glycemic control: Novolog 0-15 units TID with meals, Novolog 0-5 units QHS; Decadron 4 mg 8H  Inpatient Diabetes Program Recommendations:    Insulin: If steroids are continued as ordered, please consider ordering Levemir 6 units Q24H. If post prandial glucose is consistently elevated, consider ordering Novolog 3 units TID with meals for meal coverage if patient eats at least 50% of meals.  Thanks, Barnie Alderman, RN, MSN, CDE Diabetes Coordinator Inpatient Diabetes Program [phone] (Team Pager from 8am to 5pm)

## 2021-02-07 NOTE — Assessment & Plan Note (Addendum)
-   Successfully was weaned off supplemental oxygen, -Specific instruction of smoking cessation, NicoDerm patch provided prescribed -Continue home inhalers Dulera and albuterol inhalers as instructed -Status post IV Decadron treatment, with quick taper p.o. prescribed -Continue antibiotics was switched to clindamycin covering dental cavity
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
## 2021-02-07 NOTE — Progress Notes (Signed)
°  Transition of Care [facility]) Screening Note   Patient Details  Name: [name] Date of Birth: [birth year]   Transition of Care [facility]) CM/SW Contact:    Boneta Lucks, RN Phone Number: 02/07/2021, 2:28 PM    Transition of Care Department [facility]) has reviewed patient and no TOC needs have been identified at this time. We will continue to monitor patient advancement through interdisciplinary progression rounds. If new patient transition needs arise, please place a TOC consult.

## 2021-02-07 NOTE — Progress Notes (Signed)
RN placed patient on [place] HFNC.   Patient had desat episode while on 3L [place].  Recommended that she be put on [place] so O2 could be titrated as needed and moisture given.  Patient sat came up to 95% on 5L [place].  Patient to receive Neb treatment and hopefully this will help.  Will continue to monitor.

## 2021-02-07 NOTE — Assessment & Plan Note (Addendum)
-   Stable continue home medication, including atenolol

## 2021-02-08 LAB — HEMOGLOBIN A1C
Hgb A1c MFr Bld: 6.7 % — ABNORMAL HIGH (ref 4.8–5.6)
Mean Plasma Glucose: 146 mg/dL

## 2021-02-08 LAB — GLUCOSE, CAPILLARY
Glucose-Capillary: 232 mg/dL — ABNORMAL HIGH (ref 70–99)
Glucose-Capillary: 275 mg/dL — ABNORMAL HIGH (ref 70–99)
Glucose-Capillary: 106 mg/dL — ABNORMAL HIGH (ref 70–99)
Glucose-Capillary: 163 mg/dL — ABNORMAL HIGH (ref 70–99)

## 2021-02-08 MED ORDER — DEXAMETHASONE SODIUM PHOSPHATE 4 MG/ML IJ SOLN
4.0000 mg | Freq: Two times a day (BID) | INTRAMUSCULAR | Status: DC
  Administered 2021-02-08 – 2021-02-09 (×2): 4 mg via INTRAVENOUS
  Filled 2021-02-08 (×2): qty 1

## 2021-02-08 MED ORDER — LIDOCAINE VISCOUS HCL 2 % MT SOLN
15.0000 mL | Freq: Once | OROMUCOSAL | Status: AC
  Administered 2021-02-08: 15 mL via OROMUCOSAL
  Filled 2021-02-08: qty 15

## 2021-02-08 MED ORDER — IPRATROPIUM-ALBUTEROL 0.5-2.5 (3) MG/3ML IN SOLN
3.0000 mL | Freq: Three times a day (TID) | RESPIRATORY_TRACT | Status: DC
  Administered 2021-02-08 – 2021-02-09 (×4): 3 mL via RESPIRATORY_TRACT
  Filled 2021-02-08 (×4): qty 3

## 2021-02-08 MED ORDER — CLINDAMYCIN HCL 150 MG PO CAPS
300.0000 mg | ORAL_CAPSULE | Freq: Three times a day (TID) | ORAL | Status: DC
  Administered 2021-02-08 – 2021-02-09 (×2): 300 mg via ORAL
  Filled 2021-02-08 (×2): qty 2

## 2021-02-08 NOTE — Progress Notes (Signed)
Patient reports severe tooth pain.  She reports she had been prescribed amoxicillin in the outpatient setting but never finished it.  She has allergies to tramadol, morphine and related, and NSAIDs.  She reports all of the allergies or anaphylaxis.  Ordered viscous lidocaine.  Started clindamycin capsule.
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
## 2021-02-08 NOTE — Progress Notes (Signed)
RN came in after patient's treatment and stated patient has been complaining about [place] hurting her nose.  Patient sat on [place] HFNC at 3L was 98% so placed patient on regular  at 2L.  Patient sat was 94% when I left room and patient has on pulse ox and is being monitored.

## 2021-02-08 NOTE — Progress Notes (Signed)
Inpatient Diabetes Program Recommendations  AACE/ADA: New Consensus Statement on Inpatient Glycemic Control   Target Ranges:  Prepandial:   less than 140 mg/dL      Peak postprandial:   less than 180 mg/dL (1-2 hours)      Critically ill patients:  140 - 180 mg/dL    Latest Reference Range & Units 02/07/21 07:42 02/07/21 11:03 02/07/21 16:06 02/07/21 21:46 02/08/21 07:31 02/08/21 11:04  Glucose-Capillary 70 - 99 mg/dL 202 (H) 154 (H) 106 (H) 136 (H) 232 (H) 275 (H)   Review of Glycemic Control  Diabetes history: DM2 Outpatient Diabetes medications: Metformin 500 mg BID Current orders for Inpatient glycemic control: Novolog 0-15 units TID with meals, Novolog 0-5 units QHS; Decadron 4 mg Q12H   Inpatient Diabetes Program Recommendations:     Insulin: If steroids are continued as ordered, please consider ordering Levemir 6 units Q24H. If post prandial glucose is consistently elevated, consider ordering Novolog 3 units TID with meals for meal coverage if patient eats at least 50% of meals  Thanks, Barnie Alderman, RN, MSN, CDE Diabetes Coordinator Inpatient Diabetes Program [phone] (Team Pager from 8am to 5pm)

## 2021-02-09 DIAGNOSIS — K029 Dental caries, unspecified: Secondary | ICD-10-CM | POA: Diagnosis present

## 2021-02-09 LAB — GLUCOSE, CAPILLARY: Glucose-Capillary: 159 mg/dL — ABNORMAL HIGH (ref 70–99)

## 2021-02-09 MED ORDER — DULERA 200-5 MCG/ACT IN AERO: 2.0000 | INHALATION_SPRAY | Freq: Two times a day (BID) | RESPIRATORY_TRACT | 2 refills | Status: DC

## 2021-02-09 MED ORDER — NICOTINE 21 MG/24HR TD PT24: 21.0000 mg | MEDICATED_PATCH | Freq: Every day | TRANSDERMAL | 0 refills | Status: DC

## 2021-02-09 MED ORDER — GUAIFENESIN-DM 100-10 MG/5ML PO SYRP: 10.0000 mL | ORAL_SOLUTION | Freq: Three times a day (TID) | ORAL | 0 refills | Status: DC

## 2021-02-09 MED ORDER — ATENOLOL 100 MG PO TABS: 100.0000 mg | ORAL_TABLET | Freq: Every day | ORAL | 1 refills | Status: DC

## 2021-02-09 MED ORDER — ALBUTEROL SULFATE HFA 108 (90 BASE) MCG/ACT IN AERS: 2.0000 | INHALATION_SPRAY | Freq: Four times a day (QID) | RESPIRATORY_TRACT | 2 refills | Status: DC | PRN

## 2021-02-09 MED ORDER — ORAL CARE MOUTH RINSE: 15.0000 mL | Freq: Two times a day (BID) | OROMUCOSAL | 1 refills | Status: AC

## 2021-02-09 MED ORDER — CLINDAMYCIN HCL 300 MG PO CAPS: 300.0000 mg | ORAL_CAPSULE | Freq: Three times a day (TID) | ORAL | 0 refills | Status: AC

## 2021-02-09 MED ORDER — DEXAMETHASONE 0.5 MG PO TABS: ORAL_TABLET | ORAL | 0 refills | Status: AC

## 2021-02-09 MED ORDER — LACTINEX PO CHEW: 1.0000 | CHEWABLE_TABLET | Freq: Three times a day (TID) | ORAL | 0 refills | Status: AC

## 2021-02-09 NOTE — Assessment & Plan Note (Signed)
-   Poor dentition, dental cavities noted, -Patient instructed follow-up with dentist, oral surgeon ASAP -Multiple daily mouthwash recommended -Prescribed antibiotics clindamycin till seen by oral surgeon -As needed high-dose Tylenol for pain management
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
## 2021-04-09 NOTE — Progress Notes (Deleted)
? ?Referring Provider: Aviva Signs, MD ?Primary Care Physician:  Neale Burly, MD ?Primary Gastroenterologist:  Dr. Abbey Chatters ? ?No chief complaint on file. ? ? ?HPI:   ?[name] is a 56 y.o. female presenting today at the request of Aviva Signs, MD for diarrhea.  ? ?Patient was admitted to [facility] in August 2022 with acute cholecystitis, biliary duct dilation without definite evidence of choledocholithiasis on MRI/MRCP.  Alk phos was mildly elevated and single lab with AST 43, ALT and t bili normal. General surgery recommended no acute surgical intervention as her physical exam was not consistent with obstructive biliary tract or acute cholecystitis and patient requested to have cholecystectomy closer to home.  No indication for ERCP given normalization of AST, downtrending alk phos, and normal bilirubin.  She was treated with course of antibiotics. ? ?Follow-up with Dr. Arnoldo Morale 09/04/2020 and reported intermittent RUQ abdominal pain with eating.  Planning repeat RUQ ultrasound.  This was completed on 09/21/2020 revealing moderate gallbladder distention, upper normal CBD at 7 mm, no gallstones or visualized choledocholithiasis, technologist reported positive murphy sign.  She had follow-up with Dr. Arnoldo Morale on 12/21.  She reported some diarrhea, but no specific RUQ abdominal pain. Also reported COPD was flaring.  Dr. Arnoldo Morale did not recommend laparoscopic cholecystectomy.  Recommended follow-up as needed. Also referred to GI for diarrhea.  ? ?Today:  ? ? ?Past Medical History:  ?Diagnosis Date  ? Anxiety   ? Chronic lower back pain   ? COPD (chronic obstructive pulmonary disease) ([place])   ? DDD (degenerative disc disease)   ? Depression   ? Emphysema lung ([place])   ? GERD (gastroesophageal reflux disease)   ? High cholesterol   ? History of hiatal hernia   ? Hypertension   ? Migraine   ? "a few times/month" (04/25/2014)  ? Ovarian cancer ([place])   ? Pneumonia   ? "once or twice" (04/25/2014)   ? Thyroid disease   ? Type II diabetes mellitus ([place])   ? ? ?Past Surgical History:  ?Procedure Laterality Date  ? ABDOMINAL HYSTERECTOMY  ~ 1997  ? BACK SURGERY    ? [place]; 1995  ? DILATION AND CURETTAGE OF UTERUS    ? POSTERIOR LUMBAR FUSION  04/25/2014  ? L5-S1  ? ? ?Current Outpatient Medications  ?Medication Sig Dispense Refill  ? acetaminophen (TYLENOL) 500 MG tablet Take 1,000-3,000 mg by mouth 3 (three) times daily as needed (back pain).     ? albuterol (PROVENTIL HFA;VENTOLIN HFA) 108 (90 BASE) MCG/ACT inhaler Inhale 2 puffs into the lungs every 4 (four) hours as needed for wheezing or shortness of breath. 1 Inhaler 0  ? albuterol (VENTOLIN HFA) 108 (90 Base) MCG/ACT inhaler Inhale 2 puffs into the lungs every 6 (six) hours as needed for wheezing or shortness of breath. 8 g 2  ? aspirin EC 81 MG tablet Take 81 mg by mouth daily.    ? aspirin-acetaminophen-caffeine (EXCEDRIN MIGRAINE) 250-250-65 MG per tablet Take 2 tablets by mouth 2 (two) times daily as needed for headache or migraine.    ? atenolol (TENORMIN) 100 MG tablet Take 1 tablet (100 mg total) by mouth daily. 30 tablet 1  ? DULERA 200-5 MCG/ACT AERO Inhale 2 puffs into the lungs 2 (two) times daily. 1 each 2  ? DULoxetine (CYMBALTA) 30 MG capsule Take 30 mg by mouth daily.    ? escitalopram (LEXAPRO) 20 MG tablet Take 20 mg by mouth daily.    ?  famotidine (PEPCID) 20 MG tablet Take 20 mg by mouth daily.    ? fish oil-omega-3 fatty acids 1000 MG capsule Take 1 g by mouth daily.    ? gabapentin (NEURONTIN) 300 MG capsule Take 300 mg by mouth 3 (three) times daily.    ? guaiFENesin-dextromethorphan (ROBITUSSIN DM) 100-10 MG/5ML syrup Take 10 mLs by mouth every 8 (eight) hours. 118 mL 0  ? levothyroxine (SYNTHROID, LEVOTHROID) 25 MCG tablet Take 25 mcg by mouth daily before breakfast.     ? LORazepam (ATIVAN) 0.5 MG tablet Take 0.5 mg by mouth at bedtime.    ? metFORMIN (GLUCOPHAGE-XR) 500 MG 24 hr tablet Take 500 mg by mouth 2 (two)  times daily.    ? nicotine (NICODERM CQ - DOSED IN MG/24 HOURS) 21 mg/24hr patch Place 1 patch (21 mg total) onto the skin daily. 28 patch 0  ? rosuvastatin (CRESTOR) 10 MG tablet Take 10 mg by mouth daily.    ? ?No current facility-administered medications for this visit.  ? ? ?Allergies as of 04/11/2021 - Review Complete 02/06/2021  ?Allergen Reaction Noted  ? Cephalexin Anaphylaxis and Hives 07/16/2010  ? Flexeril [cyclobenzaprine hcl] Anaphylaxis 07/16/2010  ? Morphine and related Anaphylaxis 07/16/2010  ? Ultram [tramadol hcl] Anaphylaxis 07/16/2010  ? Nsaids Hives, Swelling, and Other (See Comments) 07/16/2010  ? Prednisone Itching 02/06/2021  ? Sulfa antibiotics Hives and Itching 07/16/2010  ? Codeine Itching and Rash 09/17/2010  ? ? ?Family History  ?Problem Relation Age of Onset  ? Diabetes Brother   ? Hypertension Other   ? Diabetes Other   ? Thyroid disease Other   ? Hyperlipidemia Other   ? ? ?Social History  ? ?Socioeconomic History  ? Marital status: Widowed  ?  Spouse name: Not on file  ? Number of children: Not on file  ? Years of education: Not on file  ? Highest education level: Not on file  ?Occupational History  ? Not on file  ?Tobacco Use  ? Smoking status: Every Day  ?  Packs/day: 0.50  ?  Years: 32.00  ?  Pack years: 16.00  ?  Types: Cigarettes  ? Smokeless tobacco: Never  ?Vaping Use  ? Vaping Use: Never used  ?Substance and Sexual Activity  ? Alcohol use: Never  ? Drug use: No  ? Sexual activity: Never  ?  Birth control/protection: Surgical  ?Other Topics Concern  ? Not on file  ?Social History Narrative  ? Not on file  ? ?Social Determinants of Health  ? ?Financial Resource Strain: Not on file  ?Food Insecurity: Not on file  ?Transportation Needs: Not on file  ?Physical Activity: Not on file  ?Stress: Not on file  ?Social Connections: Not on file  ?Intimate Partner Violence: Not on file  ? ? ?Review of Systems: ?Gen: Denies any fever, chills, cold or flulike symptoms, presyncope,  syncope. ?CV: Denies chest pain, heart palpitations. ?Resp: Denies shortness of breath or cough. ?GI: Denies dysphagia or odynophagia. Denies jaundice, hematemesis, fecal incontinence. ?GU : Denies urinary burning, urinary frequency, urinary hesitancy ?MS: Denies joint pain. ?Derm: Denies rash. ?Psych: Denies depression, anxiety. ?Heme: See HPI ? ?Physical Exam: ?There were no vitals taken for this visit. ?General:   Alert and oriented. Pleasant and cooperative. Well-nourished and well-developed.  ?Head:  Normocephalic and atraumatic. ?Eyes:  Without icterus, sclera clear and conjunctiva pink.  ?Ears:  Normal auditory acuity. ?Lungs:  Clear to auscultation bilaterally. No wheezes, rales, or rhonchi. No distress.  ?Heart:  S1, S2 present without murmurs appreciated.  ?Abdomen:  +BS, soft, non-tender and non-distended. No HSM noted. No guarding or rebound. No masses appreciated.  ?Rectal:  Deferred  ?Msk:  Symmetrical without gross deformities. Normal posture. ?Extremities:  Without edema. ?Neurologic:  Alert and  oriented x4;  grossly normal neurologically. ?Skin:  Intact without significant lesions or rashes. ?Psych: Normal mood and affect. ? ? ? ?Assessment:  ? ? ? ?Plan:  ?*** ? ? ?Aliene Altes, PA-C ?[facility] ?04/11/2021 ?  ?

## 2021-04-11 ENCOUNTER — Encounter: Payer: Self-pay | Admitting: Gastroenterology

## 2021-04-11 ENCOUNTER — Ambulatory Visit: Payer: Medicaid Other | Admitting: Gastroenterology

## 2021-05-13 DIAGNOSIS — M545 Low back pain, unspecified: Secondary | ICD-10-CM | POA: Diagnosis not present

## 2021-05-13 DIAGNOSIS — Z0000 Encounter for general adult medical examination without abnormal findings: Secondary | ICD-10-CM | POA: Diagnosis not present

## 2021-05-13 DIAGNOSIS — E1142 Type 2 diabetes mellitus with diabetic polyneuropathy: Secondary | ICD-10-CM | POA: Diagnosis not present

## 2021-05-13 DIAGNOSIS — Z6833 Body mass index (BMI) 33.0-33.9, adult: Secondary | ICD-10-CM | POA: Diagnosis not present

## 2021-08-27 DIAGNOSIS — J449 Chronic obstructive pulmonary disease, unspecified: Secondary | ICD-10-CM | POA: Diagnosis not present

## 2021-08-27 DIAGNOSIS — E1142 Type 2 diabetes mellitus with diabetic polyneuropathy: Secondary | ICD-10-CM | POA: Diagnosis not present

## 2021-08-27 DIAGNOSIS — F411 Generalized anxiety disorder: Secondary | ICD-10-CM | POA: Diagnosis not present

## 2021-08-27 DIAGNOSIS — G43919 Migraine, unspecified, intractable, without status migrainosus: Secondary | ICD-10-CM | POA: Diagnosis not present

## 2021-12-12 DIAGNOSIS — E1142 Type 2 diabetes mellitus with diabetic polyneuropathy: Secondary | ICD-10-CM | POA: Diagnosis not present

## 2021-12-12 DIAGNOSIS — G43919 Migraine, unspecified, intractable, without status migrainosus: Secondary | ICD-10-CM | POA: Diagnosis not present

## 2021-12-12 DIAGNOSIS — F411 Generalized anxiety disorder: Secondary | ICD-10-CM | POA: Diagnosis not present

## 2021-12-12 DIAGNOSIS — J449 Chronic obstructive pulmonary disease, unspecified: Secondary | ICD-10-CM | POA: Diagnosis not present

## 2023-02-17 ENCOUNTER — Emergency Department (HOSPITAL_COMMUNITY): Payer: Medicaid Other

## 2023-02-17 ENCOUNTER — Other Ambulatory Visit: Payer: Self-pay

## 2023-02-17 ENCOUNTER — Observation Stay (HOSPITAL_COMMUNITY)
Admission: EM | Admit: 2023-02-17 | Discharge: 2023-02-20 | Disposition: A | Discharge status: Home / Self Care | Payer: Medicaid Other | Attending: Internal Medicine | Admitting: Internal Medicine

## 2023-02-17 DIAGNOSIS — E039 Hypothyroidism, unspecified: Secondary | ICD-10-CM | POA: Diagnosis not present

## 2023-02-17 DIAGNOSIS — F17210 Nicotine dependence, cigarettes, uncomplicated: Secondary | ICD-10-CM | POA: Diagnosis not present

## 2023-02-17 DIAGNOSIS — Z1152 Encounter for screening for COVID-19: Secondary | ICD-10-CM | POA: Diagnosis not present

## 2023-02-17 DIAGNOSIS — J441 Chronic obstructive pulmonary disease with (acute) exacerbation: Secondary | ICD-10-CM | POA: Diagnosis not present

## 2023-02-17 DIAGNOSIS — E782 Mixed hyperlipidemia: Secondary | ICD-10-CM | POA: Diagnosis not present

## 2023-02-17 DIAGNOSIS — I10 Essential (primary) hypertension: Secondary | ICD-10-CM | POA: Diagnosis present

## 2023-02-17 DIAGNOSIS — Z8543 Personal history of malignant neoplasm of ovary: Secondary | ICD-10-CM | POA: Insufficient documentation

## 2023-02-17 DIAGNOSIS — Z7984 Long term (current) use of oral hypoglycemic drugs: Secondary | ICD-10-CM | POA: Diagnosis not present

## 2023-02-17 DIAGNOSIS — Z79899 Other long term (current) drug therapy: Secondary | ICD-10-CM | POA: Insufficient documentation

## 2023-02-17 DIAGNOSIS — E1165 Type 2 diabetes mellitus with hyperglycemia: Secondary | ICD-10-CM | POA: Diagnosis not present

## 2023-02-17 DIAGNOSIS — Z23 Encounter for immunization: Secondary | ICD-10-CM | POA: Diagnosis not present

## 2023-02-17 DIAGNOSIS — Z7982 Long term (current) use of aspirin: Secondary | ICD-10-CM | POA: Diagnosis not present

## 2023-02-17 DIAGNOSIS — J9601 Acute respiratory failure with hypoxia: Secondary | ICD-10-CM | POA: Diagnosis not present

## 2023-02-17 DIAGNOSIS — R0602 Shortness of breath: Secondary | ICD-10-CM | POA: Diagnosis present

## 2023-02-17 DIAGNOSIS — Z720 Tobacco use: Secondary | ICD-10-CM | POA: Diagnosis present

## 2023-02-17 LAB — CBC
HCT: 35.6 % — ABNORMAL LOW (ref 36.0–46.0)
Hemoglobin: 11.2 g/dL — ABNORMAL LOW (ref 12.0–15.0)
MCH: 31.8 pg (ref 26.0–34.0)
MCHC: 31.5 g/dL (ref 30.0–36.0)
MCV: 101.1 fL — ABNORMAL HIGH (ref 80.0–100.0)
Platelets: 327 10*3/uL (ref 150–400)
RBC: 3.52 MIL/uL — ABNORMAL LOW (ref 3.87–5.11)
RDW: 14.2 % (ref 11.5–15.5)
WBC: 10.4 10*3/uL (ref 4.0–10.5)
nRBC: 0 % (ref 0.0–0.2)

## 2023-02-17 LAB — BASIC METABOLIC PANEL
Anion gap: 6 (ref 5–15)
BUN: 14 mg/dL (ref 6–20)
CO2: 27 mmol/L (ref 22–32)
Calcium: 8.3 mg/dL — ABNORMAL LOW (ref 8.9–10.3)
Chloride: 104 mmol/L (ref 98–111)
Creatinine, Ser: 0.76 mg/dL (ref 0.44–1.00)
GFR, Estimated: >60 mL/min (ref >60)
Glucose, Bld: 200 mg/dL — ABNORMAL HIGH (ref 70–99)
Potassium: 3.9 mmol/L (ref 3.5–5.1)
Sodium: 137 mmol/L (ref 135–145)

## 2023-02-17 LAB — RESP PANEL BY RT-PCR (RSV, FLU A&B, COVID)  RVPGX2
Influenza A by PCR: NEGATIVE
Influenza B by PCR: NEGATIVE
Resp Syncytial Virus by PCR: NEGATIVE
SARS Coronavirus 2 by RT PCR: NEGATIVE

## 2023-02-17 MED ORDER — ALBUTEROL SULFATE (2.5 MG/3ML) 0.083% IN NEBU
10.0000 mg/h | INHALATION_SOLUTION | Freq: Once | RESPIRATORY_TRACT | Status: AC
  Administered 2023-02-17: 10 mg/h via RESPIRATORY_TRACT
  Filled 2023-02-17: qty 12

## 2023-02-17 MED ORDER — METHYLPREDNISOLONE SODIUM SUCC 125 MG IJ SOLR
125.0000 mg | Freq: Once | INTRAMUSCULAR | Status: AC
  Administered 2023-02-17: 125 mg via INTRAVENOUS
  Filled 2023-02-17: qty 2

## 2023-02-17 MED ORDER — IPRATROPIUM BROMIDE 0.02 % IN SOLN
0.5000 mg | Freq: Once | RESPIRATORY_TRACT | Status: AC
  Administered 2023-02-17: 0.5 mg via RESPIRATORY_TRACT
  Filled 2023-02-17: qty 2.5

## 2023-02-17 NOTE — ED Triage Notes (Signed)
Pt bib [facility] from home c/o SOB x2 weeks, has worsened over the last few days. Has had productive cough with brown and green sputum. Pt states she cannot breath when laying flat. EMS administered 1 nitroglycerin PTA.    Hx of COPD and CHF.

## 2023-02-17 NOTE — ED Provider Notes (Signed)
[place] EMERGENCY DEPARTMENT AT [facility] Provider Note   CSN: [id number] Arrival date & time: 02/17/23  2227     History {Add pertinent medical, surgical, social history, OB history to HPI:1} Chief Complaint  Patient presents with   Shortness of Breath    [name] is a 58 y.o. female.  Patient presents to the emergency department for evaluation of shortness of breath.  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  She reports that she used to have a nebulizer machine but lost it when she moved and Medicaid would not pay for another 1.  She is not currently using bronchodilator therapy.  Over the last few days her breathing symptoms have worsened, has a harsh productive cough.  She cannot lie flat because her breathing worsens.       Home Medications Prior to Admission medications   Medication Sig Start Date End Date Taking? Authorizing Provider  acetaminophen (TYLENOL) 500 MG tablet Take 1,000-3,000 mg by mouth 3 (three) times daily as needed (back pain).     [provider]  albuterol (PROVENTIL HFA;VENTOLIN HFA) 108 (90 BASE) MCG/ACT inhaler Inhale 2 puffs into the lungs every 4 (four) hours as needed for wheezing or shortness of breath. 04/17/11   Felisa Bonier, MD  albuterol (VENTOLIN HFA) 108 (90 Base) MCG/ACT inhaler Inhale 2 puffs into the lungs every 6 (six) hours as needed for wheezing or shortness of breath. 02/09/21   Shahmehdi, Gemma Payor, MD  aspirin EC 81 MG tablet Take 81 mg by mouth daily.    [provider]  aspirin-acetaminophen-caffeine (EXCEDRIN MIGRAINE) [phone] MG per tablet Take 2 tablets by mouth 2 (two) times daily as needed for headache or migraine.    [provider]  atenolol (TENORMIN) 100 MG tablet Take 1 tablet (100 mg total) by mouth daily. 02/10/21 03/12/21  Shahmehdi, Gemma Payor, MD  DULERA 200-5 MCG/ACT AERO Inhale 2 puffs into the lungs 2 (two) times daily. 02/09/21   Shahmehdi, Gemma Payor, MD  DULoxetine  (CYMBALTA) 30 MG capsule Take 30 mg by mouth daily.    [provider]  escitalopram (LEXAPRO) 20 MG tablet Take 20 mg by mouth daily.    [provider]  famotidine (PEPCID) 20 MG tablet Take 20 mg by mouth daily. 04/19/20   [provider]  fish oil-omega-3 fatty acids 1000 MG capsule Take 1 g by mouth daily.    [provider]  gabapentin (NEURONTIN) 300 MG capsule Take 300 mg by mouth 3 (three) times daily.    [provider]  guaiFENesin-dextromethorphan (ROBITUSSIN DM) 100-10 MG/5ML syrup Take 10 mLs by mouth every 8 (eight) hours. 02/09/21   Shahmehdi, Gemma Payor, MD  levothyroxine (SYNTHROID, LEVOTHROID) 25 MCG tablet Take 25 mcg by mouth daily before breakfast.     [provider]  LORazepam (ATIVAN) 0.5 MG tablet Take 0.5 mg by mouth at bedtime. 12/25/20   [provider]  metFORMIN (GLUCOPHAGE-XR) 500 MG 24 hr tablet Take 500 mg by mouth 2 (two) times daily.    [provider]  nicotine (NICODERM CQ - DOSED IN MG/24 HOURS) 21 mg/24hr patch Place 1 patch (21 mg total) onto the skin daily. 02/10/21   Shahmehdi, Gemma Payor, MD  rosuvastatin (CRESTOR) 10 MG tablet Take 10 mg by mouth daily.    [provider]      Allergies    Cephalexin, Flexeril [cyclobenzaprine hcl], Morphine and codeine, Ultram [tramadol hcl], Nsaids, Prednisone, Sulfa antibiotics, and Codeine  Review of Systems   Review of Systems  Physical Exam Updated Vital Signs BP (!) 172/86 (BP Location: Right Arm)   Pulse 81   Temp 98.9 F (37.2 C) (Oral)   Resp (!) 23   SpO2 96%  Physical Exam Vitals and nursing note reviewed.  Constitutional:      General: She is not in acute distress.    Appearance: She is well-developed.  HENT:     Head: Normocephalic and atraumatic.     Mouth/Throat:     Mouth: Mucous membranes are moist.  Eyes:     General: Vision grossly intact. Gaze aligned appropriately.     Extraocular Movements: Extraocular  movements intact.     Conjunctiva/sclera: Conjunctivae normal.  Cardiovascular:     Rate and Rhythm: Normal rate and regular rhythm.     Pulses: Normal pulses.     Heart sounds: Normal heart sounds, S1 normal and S2 normal. No murmur heard.    No friction rub. No gallop.  Pulmonary:     Effort: Tachypnea and accessory muscle usage present. No respiratory distress.     Breath sounds: Decreased air movement present. Decreased breath sounds and wheezing present.  Abdominal:     General: Bowel sounds are normal.     Palpations: Abdomen is soft.     Tenderness: There is no abdominal tenderness. There is no guarding or rebound.     Hernia: No hernia is present.  Musculoskeletal:        General: No swelling.     Cervical back: Full passive range of motion without pain, normal range of motion and neck supple. No spinous process tenderness or muscular tenderness. Normal range of motion.     Right lower leg: 1+ Pitting Edema present.     Left lower leg: 1+ Pitting Edema present.  Skin:    General: Skin is warm and dry.     Capillary Refill: Capillary refill takes less than 2 seconds.     Findings: No ecchymosis, erythema, rash or wound.  Neurological:     General: No focal deficit present.     Mental Status: She is alert and oriented to person, place, and time.     GCS: GCS eye subscore is 4. GCS verbal subscore is 5. GCS motor subscore is 6.     Cranial Nerves: Cranial nerves 2-12 are intact.     Sensory: Sensation is intact.     Motor: Motor function is intact.     Coordination: Coordination is intact.  Psychiatric:        Attention and Perception: Attention normal.        Mood and Affect: Mood normal.        Speech: Speech normal.        Behavior: Behavior normal.     ED Results / Procedures / Treatments   Labs (all labs ordered are listed, but only abnormal results are displayed) Labs Reviewed  RESP PANEL BY RT-PCR (RSV, FLU A&B, COVID)  RVPGX2  BASIC METABOLIC PANEL  CBC   BRAIN NATRIURETIC PEPTIDE    EKG EKG Interpretation Date/Time:  Tuesday February 17 2023 22:41:51 EST Ventricular Rate:  82 PR Interval:  106 QRS Duration:  74 QT Interval:  396 QTC Calculation: 462 R Axis:   57  Text Interpretation: Sinus rhythm with short PR Low voltage QRS Borderline ECG When compared with ECG of 06-Feb-2021 12:54, No acute changes Confirmed by Gilda Crease [phone]) on 02/17/2023 11:00:39 PM  Radiology No results found.  Procedures Procedures  {Document cardiac monitor, telemetry assessment procedure when appropriate:1}  Medications Ordered in ED Medications  methylPREDNISolone sodium succinate (SOLU-MEDROL) 125 mg/2 mL injection 125 mg (has no administration in time range)  albuterol (PROVENTIL) (2.5 MG/3ML) 0.083% nebulizer solution (has no administration in time range)  ipratropium (ATROVENT) nebulizer solution 0.5 mg (has no administration in time range)    ED Course/ Medical Decision Making/ A&P   {   Click here for ABCD2, HEART and other calculatorsREFRESH Note before signing :1}                              Medical Decision Making Amount and/or Complexity of Data Reviewed Labs: ordered. Radiology: ordered.  Risk Prescription drug management.   ***  {Document critical care time when appropriate:1} {Document review of labs and clinical decision tools ie heart score, Chads2Vasc2 etc:1}  {Document your independent review of radiology images, and any outside records:1} {Document your discussion with family members, caretakers, and with consultants:1} {Document social determinants of health affecting pt's care:1} {Document your decision making why or why not admission, treatments were needed:1} Final Clinical Impression(s) / ED Diagnoses Final diagnoses:  None    Rx / DC Orders ED Discharge Orders     None

## 2023-02-17 NOTE — ED Notes (Signed)
Patient transported to X-ray

## 2023-02-18 ENCOUNTER — Encounter (HOSPITAL_COMMUNITY): Payer: Self-pay | Admitting: Internal Medicine

## 2023-02-18 DIAGNOSIS — E782 Mixed hyperlipidemia: Secondary | ICD-10-CM | POA: Diagnosis not present

## 2023-02-18 DIAGNOSIS — Z720 Tobacco use: Secondary | ICD-10-CM

## 2023-02-18 DIAGNOSIS — J441 Chronic obstructive pulmonary disease with (acute) exacerbation: Secondary | ICD-10-CM | POA: Diagnosis not present

## 2023-02-18 DIAGNOSIS — J9601 Acute respiratory failure with hypoxia: Secondary | ICD-10-CM | POA: Diagnosis not present

## 2023-02-18 DIAGNOSIS — I10 Essential (primary) hypertension: Secondary | ICD-10-CM

## 2023-02-18 DIAGNOSIS — E039 Hypothyroidism, unspecified: Secondary | ICD-10-CM | POA: Insufficient documentation

## 2023-02-18 DIAGNOSIS — E1165 Type 2 diabetes mellitus with hyperglycemia: Secondary | ICD-10-CM | POA: Insufficient documentation

## 2023-02-18 LAB — COMPREHENSIVE METABOLIC PANEL
ALT: 30 U/L (ref 0–44)
AST: 23 U/L (ref 15–41)
Albumin: 3.1 g/dL — ABNORMAL LOW (ref 3.5–5.0)
Alkaline Phosphatase: 71 U/L (ref 38–126)
Anion gap: 10 (ref 5–15)
BUN: 12 mg/dL (ref 6–20)
CO2: 24 mmol/L (ref 22–32)
Calcium: 8.4 mg/dL — ABNORMAL LOW (ref 8.9–10.3)
Chloride: 102 mmol/L (ref 98–111)
Creatinine, Ser: 0.63 mg/dL (ref 0.44–1.00)
GFR, Estimated: >60 mL/min (ref >60)
Glucose, Bld: 346 mg/dL — ABNORMAL HIGH (ref 70–99)
Potassium: 3.7 mmol/L (ref 3.5–5.1)
Sodium: 136 mmol/L (ref 135–145)
Total Bilirubin: 0.5 mg/dL (ref 0.0–1.2)
Total Protein: 6.7 g/dL (ref 6.5–8.1)

## 2023-02-18 LAB — PHOSPHORUS: Phosphorus: 3.3 mg/dL (ref 2.5–4.6)

## 2023-02-18 LAB — CBC
HCT: 35 % — ABNORMAL LOW (ref 36.0–46.0)
Hemoglobin: 11.3 g/dL — ABNORMAL LOW (ref 12.0–15.0)
MCH: 32.3 pg (ref 26.0–34.0)
MCHC: 32.3 g/dL (ref 30.0–36.0)
MCV: 100 fL (ref 80.0–100.0)
Platelets: 328 10*3/uL (ref 150–400)
RBC: 3.5 MIL/uL — ABNORMAL LOW (ref 3.87–5.11)
RDW: 14 % (ref 11.5–15.5)
WBC: 12.3 10*3/uL — ABNORMAL HIGH (ref 4.0–10.5)
nRBC: 0 % (ref 0.0–0.2)

## 2023-02-18 LAB — BRAIN NATRIURETIC PEPTIDE: B Natriuretic Peptide: 273 pg/mL — ABNORMAL HIGH (ref 0.0–100.0)

## 2023-02-18 LAB — CBG MONITORING, ED
Glucose-Capillary: 328 mg/dL — ABNORMAL HIGH (ref 70–99)
Glucose-Capillary: 321 mg/dL — ABNORMAL HIGH (ref 70–99)

## 2023-02-18 LAB — GLUCOSE, CAPILLARY
Glucose-Capillary: 172 mg/dL — ABNORMAL HIGH (ref 70–99)
Glucose-Capillary: 157 mg/dL — ABNORMAL HIGH (ref 70–99)

## 2023-02-18 LAB — HEMOGLOBIN A1C
Hgb A1c MFr Bld: 8.9 % — ABNORMAL HIGH (ref 4.8–5.6)
Mean Plasma Glucose: 208.73 mg/dL

## 2023-02-18 LAB — HIV ANTIBODY (ROUTINE TESTING W REFLEX): HIV Screen 4th Generation wRfx: NONREACTIVE

## 2023-02-18 LAB — MAGNESIUM: Magnesium: 1.8 mg/dL (ref 1.7–2.4)

## 2023-02-18 MED ORDER — PANTOPRAZOLE SODIUM 40 MG PO TBEC
40.0000 mg | DELAYED_RELEASE_TABLET | Freq: Two times a day (BID) | ORAL | Status: DC
  Administered 2023-02-18 – 2023-02-20 (×5): 40 mg via ORAL
  Filled 2023-02-18 (×5): qty 1

## 2023-02-18 MED ORDER — BUDESONIDE 0.5 MG/2ML IN SUSP
0.5000 mg | Freq: Two times a day (BID) | RESPIRATORY_TRACT | Status: DC
  Administered 2023-02-18 – 2023-02-20 (×5): 0.5 mg via RESPIRATORY_TRACT
  Filled 2023-02-18 (×5): qty 2

## 2023-02-18 MED ORDER — ONDANSETRON HCL 4 MG/2ML IJ SOLN: 4.0000 mg | Freq: Four times a day (QID) | INTRAMUSCULAR | Status: DC | PRN

## 2023-02-18 MED ORDER — INSULIN ASPART 100 UNIT/ML IJ SOLN
3.0000 [IU] | Freq: Three times a day (TID) | INTRAMUSCULAR | Status: DC
  Administered 2023-02-18 – 2023-02-20 (×6): 3 [IU] via SUBCUTANEOUS
  Filled 2023-02-18: qty 1

## 2023-02-18 MED ORDER — ARFORMOTEROL TARTRATE 15 MCG/2ML IN NEBU
15.0000 ug | INHALATION_SOLUTION | Freq: Two times a day (BID) | RESPIRATORY_TRACT | Status: DC
  Administered 2023-02-18 – 2023-02-20 (×5): 15 ug via RESPIRATORY_TRACT
  Filled 2023-02-18 (×5): qty 2

## 2023-02-18 MED ORDER — AZITHROMYCIN 250 MG PO TABS
500.0000 mg | ORAL_TABLET | Freq: Every day | ORAL | Status: AC
  Administered 2023-02-18: 500 mg via ORAL
  Filled 2023-02-18: qty 2

## 2023-02-18 MED ORDER — PNEUMOCOCCAL 20-VAL CONJ VACC 0.5 ML IM SUSY
0.5000 mL | PREFILLED_SYRINGE | INTRAMUSCULAR | Status: AC
  Administered 2023-02-19: 0.5 mL via INTRAMUSCULAR
  Filled 2023-02-18: qty 0.5

## 2023-02-18 MED ORDER — ENOXAPARIN SODIUM 40 MG/0.4ML IJ SOSY
40.0000 mg | PREFILLED_SYRINGE | INTRAMUSCULAR | Status: DC
  Administered 2023-02-18 – 2023-02-20 (×3): 40 mg via SUBCUTANEOUS
  Filled 2023-02-18 (×3): qty 0.4

## 2023-02-18 MED ORDER — ACETAMINOPHEN 650 MG RE SUPP: 650.0000 mg | Freq: Four times a day (QID) | RECTAL | Status: DC | PRN

## 2023-02-18 MED ORDER — INSULIN GLARGINE-YFGN 100 UNIT/ML ~~LOC~~ SOLN
8.0000 [IU] | Freq: Every day | SUBCUTANEOUS | Status: DC
  Administered 2023-02-18 – 2023-02-20 (×3): 8 [IU] via SUBCUTANEOUS
  Filled 2023-02-18 (×4): qty 0.08

## 2023-02-18 MED ORDER — METHYLPREDNISOLONE SODIUM SUCC 40 MG IJ SOLR
40.0000 mg | Freq: Two times a day (BID) | INTRAMUSCULAR | Status: DC
  Administered 2023-02-18 – 2023-02-20 (×5): 40 mg via INTRAVENOUS
  Filled 2023-02-18 (×5): qty 1

## 2023-02-18 MED ORDER — ONDANSETRON HCL 4 MG PO TABS: 4.0000 mg | ORAL_TABLET | Freq: Four times a day (QID) | ORAL | Status: DC | PRN

## 2023-02-18 MED ORDER — NICOTINE 21 MG/24HR TD PT24
21.0000 mg | MEDICATED_PATCH | Freq: Every day | TRANSDERMAL | Status: DC
  Administered 2023-02-18 – 2023-02-20 (×3): 21 mg via TRANSDERMAL
  Filled 2023-02-18 (×3): qty 1

## 2023-02-18 MED ORDER — ROSUVASTATIN CALCIUM 10 MG PO TABS
10.0000 mg | ORAL_TABLET | Freq: Every day | ORAL | Status: DC
  Administered 2023-02-18 – 2023-02-20 (×3): 10 mg via ORAL
  Filled 2023-02-18 (×3): qty 1

## 2023-02-18 MED ORDER — GUAIFENESIN-DM 100-10 MG/5ML PO SYRP: 5.0000 mL | ORAL_SOLUTION | ORAL | Status: DC | PRN

## 2023-02-18 MED ORDER — IPRATROPIUM-ALBUTEROL 0.5-2.5 (3) MG/3ML IN SOLN
3.0000 mL | RESPIRATORY_TRACT | Status: DC | PRN
  Administered 2023-02-18: 3 mL via RESPIRATORY_TRACT

## 2023-02-18 MED ORDER — ATENOLOL 25 MG PO TABS
100.0000 mg | ORAL_TABLET | Freq: Every day | ORAL | Status: DC
  Administered 2023-02-18 – 2023-02-20 (×3): 100 mg via ORAL
  Filled 2023-02-18 (×3): qty 4

## 2023-02-18 MED ORDER — ACETAMINOPHEN 325 MG PO TABS
650.0000 mg | ORAL_TABLET | Freq: Four times a day (QID) | ORAL | Status: DC | PRN
  Administered 2023-02-18 – 2023-02-20 (×4): 650 mg via ORAL
  Filled 2023-02-18 (×4): qty 2

## 2023-02-18 MED ORDER — INSULIN ASPART 100 UNIT/ML IJ SOLN
0.0000 [IU] | Freq: Three times a day (TID) | INTRAMUSCULAR | Status: DC
  Administered 2023-02-18: 11 [IU] via SUBCUTANEOUS
  Filled 2023-02-18: qty 1

## 2023-02-18 MED ORDER — DM-GUAIFENESIN ER 30-600 MG PO TB12
1.0000 | ORAL_TABLET | Freq: Two times a day (BID) | ORAL | Status: DC
  Administered 2023-02-18 – 2023-02-20 (×5): 1 via ORAL
  Filled 2023-02-18 (×5): qty 1

## 2023-02-18 MED ORDER — AZITHROMYCIN 250 MG PO TABS
250.0000 mg | ORAL_TABLET | Freq: Every day | ORAL | Status: DC
  Administered 2023-02-19 – 2023-02-20 (×2): 250 mg via ORAL
  Filled 2023-02-18 (×2): qty 1

## 2023-02-18 MED ORDER — PANTOPRAZOLE SODIUM 40 MG PO TBEC: 40.0000 mg | DELAYED_RELEASE_TABLET | Freq: Every day | ORAL | Status: DC

## 2023-02-18 MED ORDER — INSULIN ASPART 100 UNIT/ML IJ SOLN
0.0000 [IU] | Freq: Three times a day (TID) | INTRAMUSCULAR | Status: DC
  Administered 2023-02-18: 11 [IU] via SUBCUTANEOUS
  Administered 2023-02-18 – 2023-02-19 (×2): 3 [IU] via SUBCUTANEOUS
  Administered 2023-02-19: 5 [IU] via SUBCUTANEOUS
  Administered 2023-02-19: 3 [IU] via SUBCUTANEOUS
  Administered 2023-02-20: 5 [IU] via SUBCUTANEOUS

## 2023-02-18 MED ORDER — SUMATRIPTAN SUCCINATE 50 MG PO TABS
50.0000 mg | ORAL_TABLET | Freq: Three times a day (TID) | ORAL | Status: DC | PRN
  Administered 2023-02-18 – 2023-02-19 (×2): 50 mg via ORAL
  Filled 2023-02-18 (×2): qty 1

## 2023-02-18 MED ORDER — INSULIN ASPART 100 UNIT/ML IJ SOLN
0.0000 [IU] | Freq: Every day | INTRAMUSCULAR | Status: DC
  Administered 2023-02-19: 2 [IU] via SUBCUTANEOUS

## 2023-02-18 MED ORDER — LEVOTHYROXINE SODIUM 25 MCG PO TABS
25.0000 ug | ORAL_TABLET | Freq: Every day | ORAL | Status: DC
Start: 2023-02-19 — End: 2023-02-19
  Filled 2023-02-18: qty 1

## 2023-02-18 MED ORDER — IPRATROPIUM-ALBUTEROL 0.5-2.5 (3) MG/3ML IN SOLN
3.0000 mL | Freq: Four times a day (QID) | RESPIRATORY_TRACT | Status: DC
  Administered 2023-02-18 – 2023-02-20 (×8): 3 mL via RESPIRATORY_TRACT
  Filled 2023-02-18 (×9): qty 3

## 2023-02-18 NOTE — Inpatient Diabetes Management (Signed)
Inpatient Diabetes Program Recommendations  AACE/ADA: New Consensus Statement on Inpatient Glycemic Control   Target Ranges:  Prepandial:   less than 140 mg/dL      Peak postprandial:   less than 180 mg/dL (1-2 hours)      Critically ill patients:  140 - 180 mg/dL    Latest Reference Range & Units 02/17/23 23:39 02/18/23 05:08  Glucose 70 - 99 mg/dL 147 (H) 829 (H)    Review of Glycemic Control  Diabetes history: DM2 Outpatient Diabetes medications: Metformin XR 500 mg BID Current orders for Inpatient glycemic control: Solumedrol 40 mg Q12H  Inpatient Diabetes Program Recommendations:    Insulin: Lab glucose 346 mg/dl this morning.  Please consider ordering CBGs AC&HS and Novolog 0-15 units TID with meals, Novolog 0-5 units at bedtime, and Novolog 3 units TID with meals for meal coverage if patient eats at least 50% of meals.  Thanks, Orlando Penner, RN, MSN, CDCES Diabetes Coordinator Inpatient Diabetes Program [phone] (Team Pager from 8am to 5pm)

## 2023-02-18 NOTE — TOC CM/SW Note (Signed)
Transition of Care [facility]) - Inpatient Brief Assessment   Patient Details  Name: DAESHA INSCO MRN: [id number] Date of Birth: 1965/03/05  Transition of Care [facility] [address]) CM/SW Contact:    Villa Herb, LCSWA Phone Number: 02/18/2023, 10:06 AM   Clinical Narrative: Transition of Care Department [facility]) has reviewed patient and no TOC needs have been identified at this time. We will continue to monitor patient advancement through interdiciplinary progression rounds. If new patient transition needs arise, please place a TOC consult.   Transition of Care Asessment: Insurance and Status: Insurance coverage has been reviewed Patient has primary care physician: Yes Home environment has been reviewed: From home Prior level of function:: Independent Prior/Current Home Services: No current home services Social Drivers of Health Review: SDOH reviewed no interventions necessary Readmission risk has been reviewed: Yes Transition of care needs: no transition of care needs at this time

## 2023-02-18 NOTE — ED Notes (Signed)
SCD's placed.

## 2023-02-18 NOTE — Progress Notes (Signed)
Patient seen and examined; admitted after midnight secondary to increased shortness of breath and wheezing; no fever, no chest pain, no nausea or vomiting.  Of note patient continues smoking.  Please refer to H&P written by Dr. Thomes Dinning on 02/18/2023 for further info/details on admission.  Plan: -Continue steroids, mucolytic's, nebulizer management, antibiotics and flutter valve -Wean off oxygen supplementation and check for desaturation screening -Tobacco cessation counseling provided; nicotine patch has been ordered. -Follow clinical response.  Tanya Loll MD [phone]

## 2023-02-18 NOTE — ED Notes (Signed)
Walked in to find the patient removed her oxygen. Patient had a SpO2 reading of 82 on room air. Patient present with increased of respirations and unable to complete full sentences. Oxygen was placed back on the patient.

## 2023-02-18 NOTE — H&P (Signed)
History and Physical    Patient: [name] ZOX:[id number] DOB: 10/23/1965 DOA: 02/17/2023 DOS: the patient was seen and examined on 02/18/2023 PCP: Toma Deiters, MD  Patient coming from: Home  Chief Complaint:  Chief Complaint  Patient presents with   Shortness of Breath   HPI: [name] is a 58 y.o. female with medical history significant of type 2 diabetes mellitus, COPD, not on home oxygen, hypertension, hyperlipidemia, tobacco abuse who presents emergency department due to 2-week onset of shortness of breath which worsened within the last few days, but got worse within the last 24 hours.  Cough was only intermittently productive with greenish tinged sputum.  Patient states that she lost her nebulizer machine when she moved and Medicaid will not pay for another one, patient only use Trelegy inhaler intermittently and was not using any other bronchodilator therapy at this time.  She complained of not being able to lie flat due to worsening shortness of breath.  She continues to smoke cigarettes.  EMS was activated, on arrival of EMS team, 1 nitroglycerin sublingual was provided PTA.  ED Course: In the emergency department, she was tachypneic, BP was 172/86, other vital signs were within normal range.  Workup in the ED showed macrocytic anemia, BMP was normal except for blood glucose of 200.  BNP 273.  Influenza A, B, SARS coronavirus 2, RSV was negative. Chest x-ray showed no acute intrathoracic process Breathing treatment was provided, IV Solu-Medrol 105 mg x 1 was given.  Hospitalist was asked to admit patient for further evaluation and management.  Review of Systems: Review of systems as noted in the HPI. All other systems reviewed and are negative.   Past Medical History:  Diagnosis Date   Anxiety    Chronic lower back pain    COPD (chronic obstructive pulmonary disease) (HCC)    DDD (degenerative disc disease)    Depression    Emphysema lung (HCC)    GERD  (gastroesophageal reflux disease)    High cholesterol    History of hiatal hernia    Hypertension    Migraine    "a few times/month" (04/25/2014)   Ovarian cancer (HCC)    Pneumonia    "once or twice" (04/25/2014)   Thyroid disease    Type II diabetes mellitus (HCC)    Past Surgical History:  Procedure Laterality Date   ABDOMINAL HYSTERECTOMY  ~ 1997   BACK SURGERY     CESAREAN SECTION  1988; 1995   DILATION AND CURETTAGE OF UTERUS     POSTERIOR LUMBAR FUSION  04/25/2014   L5-S1    Social History:  reports that she has been smoking cigarettes. She has a 16 pack-year smoking history. She has never used smokeless tobacco. She reports that she does not drink alcohol and does not use drugs.   Allergies  Allergen Reactions   Cephalexin Anaphylaxis and Hives   Flexeril [Cyclobenzaprine Hcl] Anaphylaxis   Morphine And Codeine Anaphylaxis   Ultram [Tramadol Hcl] Anaphylaxis   Nsaids Hives, Swelling and Other (See Comments)    Skin feels hot "burning"   Prednisone Itching    Hands and feet burn and itch   Sulfa Antibiotics Hives and Itching   Codeine Itching and Rash    Skin feels hot, "burning"    Family History  Problem Relation Age of Onset   Diabetes Brother    Hypertension Other    Diabetes Other    Thyroid disease Other    Hyperlipidemia Other  Prior to Admission medications   Medication Sig Start Date End Date Taking? Authorizing Provider  acetaminophen (TYLENOL) 500 MG tablet Take 1,000-3,000 mg by mouth 3 (three) times daily as needed (back pain).     [provider]  albuterol (PROVENTIL HFA;VENTOLIN HFA) 108 (90 BASE) MCG/ACT inhaler Inhale 2 puffs into the lungs every 4 (four) hours as needed for wheezing or shortness of breath. 04/17/11   Felisa Bonier, MD  albuterol (VENTOLIN HFA) 108 (90 Base) MCG/ACT inhaler Inhale 2 puffs into the lungs every 6 (six) hours as needed for wheezing or shortness of breath. 02/09/21   Shahmehdi, Gemma Payor, MD  aspirin  EC 81 MG tablet Take 81 mg by mouth daily.    [provider]  aspirin-acetaminophen-caffeine (EXCEDRIN MIGRAINE) [phone] MG per tablet Take 2 tablets by mouth 2 (two) times daily as needed for headache or migraine.    [provider]  atenolol (TENORMIN) 100 MG tablet Take 1 tablet (100 mg total) by mouth daily. 02/10/21 03/12/21  Shahmehdi, Gemma Payor, MD  DULERA 200-5 MCG/ACT AERO Inhale 2 puffs into the lungs 2 (two) times daily. 02/09/21   Shahmehdi, Gemma Payor, MD  DULoxetine (CYMBALTA) 30 MG capsule Take 30 mg by mouth daily.    [provider]  escitalopram (LEXAPRO) 20 MG tablet Take 20 mg by mouth daily.    [provider]  famotidine (PEPCID) 20 MG tablet Take 20 mg by mouth daily. 04/19/20   [provider]  fish oil-omega-3 fatty acids 1000 MG capsule Take 1 g by mouth daily.    [provider]  gabapentin (NEURONTIN) 300 MG capsule Take 300 mg by mouth 3 (three) times daily.    [provider]  guaiFENesin-dextromethorphan (ROBITUSSIN DM) 100-10 MG/5ML syrup Take 10 mLs by mouth every 8 (eight) hours. 02/09/21   Shahmehdi, Gemma Payor, MD  levothyroxine (SYNTHROID, LEVOTHROID) 25 MCG tablet Take 25 mcg by mouth daily before breakfast.     [provider]  LORazepam (ATIVAN) 0.5 MG tablet Take 0.5 mg by mouth at bedtime. 12/25/20   [provider]  metFORMIN (GLUCOPHAGE-XR) 500 MG 24 hr tablet Take 500 mg by mouth 2 (two) times daily.    [provider]  nicotine (NICODERM CQ - DOSED IN MG/24 HOURS) 21 mg/24hr patch Place 1 patch (21 mg total) onto the skin daily. 02/10/21   Shahmehdi, Gemma Payor, MD  rosuvastatin (CRESTOR) 10 MG tablet Take 10 mg by mouth daily.    [provider]    Physical Exam: BP (!) 151/85   Pulse 85   Temp 98.3 F (36.8 C) (Oral)   Resp 17   SpO2 95%   General: 58 y.o. year-old female well developed well nourished in no acute distress.  Alert and oriented x3. HEENT: NCAT,  EOMI Neck: Supple, trachea medial Cardiovascular: Regular rate and rhythm with no rubs or gallops.  No thyromegaly or JVD noted.  Trace lower extremity edema. 2/4 pulses in all 4 extremities. Respiratory: Tachypnea.  Diffuse rhonchi and expiratory wheezes on auscultation.   Abdomen: Soft, nontender nondistended with normal bowel sounds x4 quadrants. Muskuloskeletal: No cyanosis, clubbing noted bilaterally Neuro: CN II-XII intact, strength 5/5 x 4, sensation, reflexes intact Skin: No ulcerative lesions noted or rashes Psychiatry: Judgement and insight appear normal. Mood is appropriate for condition and setting          Labs on Admission:  Basic Metabolic Panel: Recent Labs  Lab 02/17/23 2339 02/18/23 0508  NA 137  136  K 3.9 3.7  CL 104 102  CO2 27 24  GLUCOSE 200* 346*  BUN 14 12  CREATININE 0.76 0.63  CALCIUM 8.3* 8.4*  MG  --  1.8  PHOS  --  3.3   Liver Function Tests: Recent Labs  Lab 02/18/23 0508  AST 23  ALT 30  ALKPHOS 71  BILITOT 0.5  PROT 6.7  ALBUMIN 3.1*   No results for input(s): "LIPASE", "AMYLASE" in the last 168 hours. No results for input(s): "AMMONIA" in the last 168 hours. CBC: Recent Labs  Lab 02/17/23 2339 02/18/23 0508  WBC 10.4 12.3*  HGB 11.2* 11.3*  HCT 35.6* 35.0*  MCV 101.1* 100.0  PLT 327 328   Cardiac Enzymes: No results for input(s): "CKTOTAL", "CKMB", "CKMBINDEX", "TROPONINI" in the last 168 hours.  BNP (last 3 results) Recent Labs    02/17/23 2339  BNP 273.0*    ProBNP (last 3 results) No results for input(s): "PROBNP" in the last 8760 hours.  CBG: No results for input(s): "GLUCAP" in the last 168 hours.  Radiological Exams on Admission: DG Chest 2 View Result Date: 02/17/2023 CLINICAL DATA:  Short of breath, productive cough EXAM: CHEST - 2 VIEW COMPARISON:  02/06/2021 FINDINGS: Frontal and lateral views of the chest demonstrate an unremarkable cardiac silhouette. No acute airspace disease, effusion, or  pneumothorax. No acute bony abnormalities. IMPRESSION: 1. No acute intrathoracic process. Electronically Signed   By: Sharlet Salina M.D.   On: 02/17/2023 23:11    EKG: I independently viewed the EKG done and my findings are as followed: Normal sinus rhythm at a rate of 82 bpm  Assessment/Plan Present on Admission:  Acute exacerbation of chronic obstructive pulmonary disease (COPD) (HCC)  Acute respiratory failure with hypoxia (HCC)  Essential hypertension  Mixed hyperlipidemia  Tobacco abuse  Principal Problem:   Acute exacerbation of chronic obstructive pulmonary disease (COPD) (HCC) Active Problems:   Mixed hyperlipidemia   Essential hypertension   Acute respiratory failure with hypoxia (HCC)   Tobacco abuse   Type 2 diabetes mellitus with hyperglycemia (HCC)   Acquired hypothyroidism   Acute exacerbation of COPD Acute respiratory failure with hypoxia Continue duo nebs, Mucinex, Solu-Medrol, azithromycin. Continue Protonix to prevent steroid-induced ulcer Continue incentive spirometry and flutter valve Continue supplemental oxygen to maintain O2 sat > 92% with plan to wean patient off oxygen as tolerated  Type 2 diabetes mellitus with hyperglycemia Continue ISS and hypoglycemia protocol  Essential hypertension Continue atenolol  Mixed hyperlipidemia Continue Crestor  Acquired hypothyroidism Continue Synthroid  Tobacco abuse Patient was counseled on tobacco abuse cessation Continue nicotine patch  DVT prophylaxis: Lovenox  Code Status: Full code   Advance Care Planning:   Code Status: Full Code   Consults: None   Severity of Illness: The appropriate patient status for this patient is OBSERVATION. Observation status is judged to be reasonable and necessary in order to provide the required intensity of service to ensure the patient's safety. The patient's presenting symptoms, physical exam findings, and initial radiographic and laboratory data in the context  of their medical condition is felt to place them at decreased risk for further clinical deterioration. Furthermore, it is anticipated that the patient will be medically stable for discharge from the hospital within 2 midnights of admission.   Author: Frankey Shown, DO 02/18/2023 7:24 AM  For on call review [email or web address].

## 2023-02-19 DIAGNOSIS — E1165 Type 2 diabetes mellitus with hyperglycemia: Secondary | ICD-10-CM | POA: Diagnosis not present

## 2023-02-19 DIAGNOSIS — E782 Mixed hyperlipidemia: Secondary | ICD-10-CM | POA: Diagnosis not present

## 2023-02-19 DIAGNOSIS — Z794 Long term (current) use of insulin: Secondary | ICD-10-CM

## 2023-02-19 DIAGNOSIS — J441 Chronic obstructive pulmonary disease with (acute) exacerbation: Secondary | ICD-10-CM | POA: Diagnosis not present

## 2023-02-19 DIAGNOSIS — I10 Essential (primary) hypertension: Secondary | ICD-10-CM | POA: Diagnosis not present

## 2023-02-19 LAB — GLUCOSE, CAPILLARY
Glucose-Capillary: 248 mg/dL — ABNORMAL HIGH (ref 70–99)
Glucose-Capillary: 187 mg/dL — ABNORMAL HIGH (ref 70–99)
Glucose-Capillary: 195 mg/dL — ABNORMAL HIGH (ref 70–99)
Glucose-Capillary: 235 mg/dL — ABNORMAL HIGH (ref 70–99)

## 2023-02-19 LAB — LIPID PANEL
Cholesterol: 239 mg/dL — ABNORMAL HIGH (ref 0–200)
HDL: 62 mg/dL (ref >40)
LDL Cholesterol: 145 mg/dL — ABNORMAL HIGH (ref 0–99)
Total CHOL/HDL Ratio: 3.9 {ratio}
Triglycerides: 160 mg/dL — ABNORMAL HIGH (ref <150)
VLDL: 32 mg/dL (ref 0–40)

## 2023-02-19 LAB — TSH: TSH: 1.961 u[IU]/mL (ref 0.350–4.500)

## 2023-02-19 MED ORDER — LEVOTHYROXINE SODIUM 25 MCG PO TABS
25.0000 ug | ORAL_TABLET | Freq: Every day | ORAL | Status: DC
  Administered 2023-02-20: 25 ug via ORAL
  Filled 2023-02-19: qty 1

## 2023-02-19 MED ORDER — OXYCODONE HCL 5 MG PO TABS
5.0000 mg | ORAL_TABLET | Freq: Three times a day (TID) | ORAL | Status: DC | PRN
  Administered 2023-02-19 – 2023-02-20 (×2): 5 mg via ORAL
  Filled 2023-02-19 (×2): qty 1

## 2023-02-19 NOTE — Progress Notes (Signed)
Progress Note   Patient: [name] JYN:[id number] DOB: February 15, 1965 DOA: 02/17/2023     0 DOS: the patient was seen and examined on 02/19/2023   Brief hospital course: As per H&P written by Dr. Sanda Klein on 02/18/2023 -EDELIN FRYER is a 58 y.o. female with medical history significant of type 2 diabetes mellitus, COPD, not on home oxygen, hypertension, hyperlipidemia, tobacco abuse who presents emergency department due to 2-week onset of shortness of breath which worsened within the last few days, but got worse within the last 24 hours.  Cough was only intermittently productive with greenish tinged sputum.  Patient states that she lost her nebulizer machine when she moved and Medicaid will not pay for another one, patient only use Trelegy inhaler intermittently and was not using any other bronchodilator therapy at this time.  She complained of not being able to lie flat due to worsening shortness of breath.  She continues to smoke cigarettes.  EMS was activated, on arrival of EMS team, 1 nitroglycerin sublingual was provided PTA.   ED Course: In the emergency department, she was tachypneic, BP was 172/86, other vital signs were within normal range.  Workup in the ED showed macrocytic anemia, BMP was normal except for blood glucose of 200.  BNP 273.  Influenza A, B, SARS coronavirus 2, RSV was negative. Chest x-ray showed no acute intrathoracic process Breathing treatment was provided, IV Solu-Medrol 105 mg x 1 was given.  Hospitalist was asked to admit patient for further evaluation and management.  Assessment and Plan: 1-acute respiratory failure with hypoxia secondary to COPD exacerbation -Continue to wean oxygen supplementation as tolerated and check desaturation screening -Continue treatment with steroids, bronchodilator management, mucolytic's flutter valve or incentive spirometer. -Tobacco cessation has been encouraged. -Patient expressed feeling better, but is still showing that I  have a mild difficulty speaking in full sentences.  2-type 2 diabetes mellitus with hyperglycemia -Continue sliding scale insulin and the use of Semglee -Follow CBGs fluctuation -Anticipating elevated CBGs levels with the use of steroids.  3-GERD -Continue PPI.  4-chronic back pain -Continue treatment with Tylenol and as needed oxycodone for severe discomfort.  5-hypothyroidism -Continue Synthroid.  6-hypertension -Continue atenolol.  7-hyperlipidemia -Continue statin.  8-tobacco abuse -Cessation counseling provided -Continue nicotine patch.  9-class III obesity -Body mass index is 45.87 kg/m. -Low-calorie diet and portion control discussed with patient.  Subjective:  Feeling slightly better and breathing somewhat easier.  Patient reports she still still winded with exertion and experiencing expiratory wheezing.  2-3 L in place to maintain saturation.  Physical Exam: Vitals:   02/19/23 0337 02/19/23 0801 02/19/23 0855 02/19/23 1425  BP: (!) 145/82  (!) 143/91 (!) 143/90  Pulse: 63  79 67  Resp: 17   16  Temp: 98.1 F (36.7 C)   97.9 F (36.6 C)  TempSrc: Oral   Oral  SpO2: 98% 96%  98%  Weight:      Height:       General exam: Alert, awake, oriented x 3; complaining of back pain. Respiratory system: Fair air movement; no using accessory muscle.  Positive expiratory wheezing.  Cardiovascular system:RRR. No murmurs, rubs, gallops. Gastrointestinal system: Abdomen is obese, nondistended, soft and nontender. No organomegaly or masses felt. Normal bowel sounds heard. Central nervous system: No focal neurological deficits. Extremities: no cyanosis, no clubbing. Skin: No rashes, lesions or ulcers Psychiatry: Judgement and insight appear normal. Mood & affect appropriate.   Data Reviewed: TSH: 1.961 Lipid panel: total cholesterol 239, LDL 145,  triglyceride 160 and HDL 62  Family Communication: No family at bedside.  Disposition: Status is: Observation The  patient remains OBS appropriate and will d/c before 2 midnights.   Planned Discharge Destination: Home  Time spent: 50 minutes  Author: Vassie Loll, MD 02/19/2023 7:49 PM  For on call review [email or web address].

## 2023-02-19 NOTE — Progress Notes (Signed)
   02/19/23 2202  Oxygen Therapy  SpO2 98 %  O2 Device Room Air  Patient Activity (if Appropriate) Ambulating  Pulse Oximetry Type Continuous

## 2023-02-20 DIAGNOSIS — E1165 Type 2 diabetes mellitus with hyperglycemia: Secondary | ICD-10-CM | POA: Diagnosis not present

## 2023-02-20 DIAGNOSIS — E039 Hypothyroidism, unspecified: Secondary | ICD-10-CM | POA: Diagnosis not present

## 2023-02-20 DIAGNOSIS — J441 Chronic obstructive pulmonary disease with (acute) exacerbation: Secondary | ICD-10-CM | POA: Diagnosis not present

## 2023-02-20 DIAGNOSIS — Z720 Tobacco use: Secondary | ICD-10-CM | POA: Diagnosis not present

## 2023-02-20 LAB — BASIC METABOLIC PANEL
Anion gap: 10 (ref 5–15)
BUN: 24 mg/dL — ABNORMAL HIGH (ref 6–20)
CO2: 27 mmol/L (ref 22–32)
Calcium: 8.6 mg/dL — ABNORMAL LOW (ref 8.9–10.3)
Chloride: 98 mmol/L (ref 98–111)
Creatinine, Ser: 0.76 mg/dL (ref 0.44–1.00)
GFR, Estimated: >60 mL/min (ref >60)
Glucose, Bld: 253 mg/dL — ABNORMAL HIGH (ref 70–99)
Potassium: 3.8 mmol/L (ref 3.5–5.1)
Sodium: 135 mmol/L (ref 135–145)

## 2023-02-20 LAB — GLUCOSE, CAPILLARY
Glucose-Capillary: 215 mg/dL — ABNORMAL HIGH (ref 70–99)
Glucose-Capillary: 146 mg/dL — ABNORMAL HIGH (ref 70–99)

## 2023-02-20 MED ORDER — LEVOTHYROXINE SODIUM 25 MCG PO TABS: 25.0000 ug | ORAL_TABLET | Freq: Every day | ORAL | 1 refills | Status: DC

## 2023-02-20 MED ORDER — PANTOPRAZOLE SODIUM 40 MG PO TBEC: 40.0000 mg | DELAYED_RELEASE_TABLET | Freq: Two times a day (BID) | ORAL | 1 refills | Status: DC

## 2023-02-20 MED ORDER — DM-GUAIFENESIN ER 30-600 MG PO TB12: 1.0000 | ORAL_TABLET | Freq: Two times a day (BID) | ORAL | 0 refills | Status: AC

## 2023-02-20 MED ORDER — NICOTINE 21 MG/24HR TD PT24: 21.0000 mg | MEDICATED_PATCH | Freq: Every day | TRANSDERMAL | 1 refills | Status: AC

## 2023-02-20 MED ORDER — FARXIGA 10 MG PO TABS: 10.0000 mg | ORAL_TABLET | Freq: Every day | ORAL | 1 refills | Status: AC

## 2023-02-20 MED ORDER — ATENOLOL 100 MG PO TABS: 100.0000 mg | ORAL_TABLET | Freq: Every day | ORAL | 1 refills | Status: AC

## 2023-02-20 MED ORDER — PREDNISONE 20 MG PO TABS: ORAL_TABLET | ORAL | 0 refills | Status: DC

## 2023-02-20 MED ORDER — GABAPENTIN 300 MG PO CAPS: 300.0000 mg | ORAL_CAPSULE | Freq: Three times a day (TID) | ORAL | 1 refills | Status: AC

## 2023-02-20 MED ORDER — ROSUVASTATIN CALCIUM 10 MG PO TABS: 10.0000 mg | ORAL_TABLET | Freq: Every day | ORAL | 1 refills | Status: AC

## 2023-02-20 MED ORDER — DOXYCYCLINE HYCLATE 100 MG PO TABS: 100.0000 mg | ORAL_TABLET | Freq: Two times a day (BID) | ORAL | 0 refills | Status: AC

## 2023-02-20 MED ORDER — ALBUTEROL SULFATE HFA 108 (90 BASE) MCG/ACT IN AERS
2.0000 | INHALATION_SPRAY | Freq: Four times a day (QID) | RESPIRATORY_TRACT | 2 refills | Status: AC | PRN
Start: 2023-02-20 — End: ?

## 2023-02-20 MED ORDER — METFORMIN HCL ER 500 MG PO TB24: 500.0000 mg | ORAL_TABLET | Freq: Two times a day (BID) | ORAL | 1 refills | Status: AC

## 2023-02-20 MED ORDER — OXYCODONE HCL 5 MG PO TABS: 5.0000 mg | ORAL_TABLET | Freq: Three times a day (TID) | ORAL | 0 refills | Status: AC | PRN

## 2023-02-20 NOTE — Inpatient Diabetes Management (Signed)
  Inpatient Diabetes Program Recommendations  AACE/ADA: New Consensus Statement on Inpatient Glycemic Control   Target Ranges:  Prepandial:   less than 140 mg/dL      Peak postprandial:   less than 180 mg/dL (1-2 hours)      Critically ill patients:  140 - 180 mg/dL    Latest Reference Range & Units 02/19/23 07:54 02/19/23 11:08 02/19/23 16:52 02/19/23 20:25 02/20/23 07:20  Glucose-Capillary 70 - 99 mg/dL 756 (H) 433 (H) 295 (H) 235 (H) 215 (H)    Review of Glycemic Control  Diabetes history: DM2 Outpatient Diabetes medications: Metformin XR 500 mg BID Current orders for Inpatient glycemic control: Semglee 8 units daily, Novolog 0-15 units TID with meals, Novolog 0-5 units at bedtime, Novolog 3 units TID with meals; Solumedrol 40 mg Q12H  Inpatient Diabetes Program Recommendations:    Insulin: If steroids are continued as ordered, please consider increasing Semglee to 10 units daily and meal coverage to Novolog 5 units TID with meals.  Thanks, Orlando Penner, RN, MSN, CDCES Diabetes Coordinator Inpatient Diabetes Program [phone] (Team Pager from 8am to 5pm)

## 2023-02-20 NOTE — Discharge Summary (Signed)
Physician Discharge Summary   Patient: [name] MRN: [id number] DOB: [birth year]  Admit date:     02/17/2023  Discharge date: 02/20/23  Discharge Physician: Vassie Loll   PCP: Toma Deiters, MD   Recommendations at discharge:  Repeat basic metabolic panel to follow electrolytes and renal function Reassess blood pressure and adjust antihypertensive regimen as needed Continue to follow CBG fluctuation and further adjust hypoglycemic regimen as required Assist patient with smoking cessation.  Resources  Discharge Diagnoses: Principal Problem:   Acute exacerbation of chronic obstructive pulmonary disease (COPD) (HCC) Active Problems:   Mixed hyperlipidemia   Essential hypertension   Acute respiratory failure with hypoxia (HCC)   Tobacco abuse   Type 2 diabetes mellitus with hyperglycemia [place])   Acquired hypothyroidism  Brief Hospital admission Course: As per H&P written by Dr. Sanda Klein on 02/18/2023 -[name] is a 58 y.o. female with medical history significant of type 2 diabetes mellitus, COPD, not on home oxygen, hypertension, hyperlipidemia, tobacco abuse who presents emergency department due to 2-week onset of shortness of breath which worsened within the last few days, but got worse within the last 24 hours.  Cough was only intermittently productive with greenish tinged sputum.  Patient states that she lost her nebulizer machine when she moved and Medicaid will not pay for another one, patient only use Trelegy inhaler intermittently and was not using any other bronchodilator therapy at this time.  She complained of not being able to lie flat due to worsening shortness of breath.  She continues to smoke cigarettes.  EMS was activated, on arrival of EMS team, 1 nitroglycerin sublingual was provided PTA.   ED Course: In the emergency department, she was tachypneic, BP was 172/86, other vital signs were within normal range.  Workup in the ED showed macrocytic  anemia, BMP was normal except for blood glucose of 200.  BNP 273.  Influenza A, B, SARS coronavirus 2, RSV was negative. Chest x-ray showed no acute intrathoracic process Breathing treatment was provided, IV Solu-Medrol 105 mg x 1 was given.  Hospitalist was asked to admit patient for further evaluation and management.  Assessment and Plan: 1-acute respiratory failure with hypoxia secondary to COPD exacerbation -Oxygen completely weaned off at discharge and good saturation demonstrated. -Continue treatment with steroids (tapering at discharge), oral antibiotic, bronchodilator management, mucolytic's flutter valve or incentive spirometer. -Tobacco cessation has been encouraged. -Patient expressed feeling better, but is still showing that I have a mild difficulty speaking in full sentences. -Outpatient follow-up with pulmonologist for PFTs and further adjustment into maintenance therapy recommended.   2-type 2 diabetes mellitus with hyperglycemia -Continue sliding scale insulin and the use of Semglee -Follow CBGs fluctuation -Anticipating elevated CBGs levels with the use of steroids.   3-GERD -Continue PPI.   4-chronic back pain -Continue treatment with Tylenol and as needed oxycodone for severe discomfort.   5-hypothyroidism -Continue Synthroid.   6-hypertension -Continue atenolol. -Heart healthy/low-sodium diet discussed with patient.   7-hyperlipidemia -Continue statin.   8-tobacco abuse -Cessation counseling provided -Continue nicotine patch.   9-class III obesity -Body mass index is 45.87 kg/m. -Low-calorie diet and portion control discussed with patient.  Consultants: None Procedures performed: See below for x-ray reports. Disposition: Home Diet recommendation: Heart healthy/modified carbohydrate diet.    DISCHARGE MEDICATION: Allergies as of 02/20/2023       Reactions   Cephalexin Anaphylaxis, Hives   Flexeril [cyclobenzaprine Hcl] Anaphylaxis   Morphine  And Codeine Anaphylaxis   Ultram [tramadol Hcl] Anaphylaxis  Nsaids Hives, Swelling, Other (See Comments)   Skin feels hot "burning"   Prednisone Itching   Hands and feet burn and itch   Sulfa Antibiotics Hives, Itching   Codeine Itching, Rash   Skin feels hot, "burning"        Medication List     STOP taking these medications    aspirin-acetaminophen-caffeine 250-250-65 MG tablet Commonly known as: EXCEDRIN MIGRAINE   benzonatate 100 MG capsule Commonly known as: TESSALON   famotidine 20 MG tablet Commonly known as: PEPCID   guaiFENesin-dextromethorphan 100-10 MG/5ML syrup Commonly known as: ROBITUSSIN DM Replaced by: dextromethorphan-guaiFENesin 30-600 MG 12hr tablet       TAKE these medications    acetaminophen 500 MG tablet Commonly known as: TYLENOL Take 1,000-3,000 mg by mouth 3 (three) times daily as needed (back pain).   albuterol 108 (90 Base) MCG/ACT inhaler Commonly known as: VENTOLIN HFA Inhale 2 puffs into the lungs every 6 (six) hours as needed for wheezing or shortness of breath. What changed:  when to take this Another medication with the same name was removed. Continue taking this medication, and follow the directions you see here.   aspirin EC 81 MG tablet Take 81 mg by mouth daily.   atenolol 100 MG tablet Commonly known as: TENORMIN Take 1 tablet (100 mg total) by mouth daily.   dextromethorphan-guaiFENesin 30-600 MG 12hr tablet Commonly known as: MUCINEX DM Take 1 tablet by mouth 2 (two) times daily for 10 days. Replaces: guaiFENesin-dextromethorphan 100-10 MG/5ML syrup   doxycycline 100 MG tablet Commonly known as: VIBRA-TABS Take 1 tablet (100 mg total) by mouth 2 (two) times daily for 5 days.   Dulera 200-5 MCG/ACT Aero Generic drug: mometasone-formoterol Inhale 2 puffs into the lungs 2 (two) times daily.   DULoxetine 30 MG capsule Commonly known as: CYMBALTA Take 30 mg by mouth daily.   escitalopram 20 MG  tablet Commonly known as: LEXAPRO Take 20 mg by mouth daily.   Farxiga 10 MG Tabs tablet Generic drug: dapagliflozin propanediol Take 1 tablet (10 mg total) by mouth daily.   fish oil-omega-3 fatty acids 1000 MG capsule Take 1 g by mouth daily.   gabapentin 300 MG capsule Commonly known as: NEURONTIN Take 1 capsule (300 mg total) by mouth 3 (three) times daily.   levothyroxine 25 MCG tablet Commonly known as: SYNTHROID Take 1 tablet (25 mcg total) by mouth daily before breakfast.   LORazepam 0.5 MG tablet Commonly known as: ATIVAN Take 0.5 mg by mouth at bedtime.   metFORMIN 500 MG 24 hr tablet Commonly known as: GLUCOPHAGE-XR Take 1 tablet (500 mg total) by mouth 2 (two) times daily.   nicotine 21 mg/24hr patch Commonly known as: NICODERM CQ - dosed in mg/24 hours Place 1 patch (21 mg total) onto the skin daily.   olmesartan 20 MG tablet Commonly known as: BENICAR Take 20 mg by mouth daily.   oxyCODONE 5 MG immediate release tablet Commonly known as: Oxy IR/ROXICODONE Take 1 tablet (5 mg total) by mouth every 8 (eight) hours as needed for severe pain (pain score 7-10).   pantoprazole 40 MG tablet Commonly known as: PROTONIX Take 1 tablet (40 mg total) by mouth 2 (two) times daily.   predniSONE 20 MG tablet Commonly known as: DELTASONE Take 3 tablets by mouth daily x 1 day; then 2 tablets by mouth daily x 2 days; then 1 tablet by mouth daily x 3 days; then half tablet by mouth daily x 3 days and stop  prednisone.   rosuvastatin 10 MG tablet Commonly known as: CRESTOR Take 1 tablet (10 mg total) by mouth daily.        Follow-up Information     Toma Deiters, MD. Schedule an appointment as soon as possible for a visit in 10 day(s).   Specialty: Internal Medicine Contact information: [address] [place] [address] [phone]                Discharge Exam: Filed Weights   02/18/23 1541  Weight: 74 kg   General exam: Alert, awake,  oriented x 3; speaking in full sentences; no requiring oxygen supplementation and feeling ready for discharge. Respiratory system: Very little expiratory wheezing; positive rhonchi, no using accessory muscles. Cardiovascular system:RRR. No rubs or gallops; no JVD.  Patient Gastrointestinal system: Abdomen is obese, nondistended, soft and nontender. No organomegaly or masses felt. Normal bowel sounds heard. Central nervous system: No focal neurological deficits. Extremities: No cyanosis or clubbing; no edema. Skin: No petechiae. Psychiatry: Judgement and insight appear normal. Mood & affect appropriate.    Condition at discharge: Stable and improved.  The results of significant diagnostics from this hospitalization (including imaging, microbiology, ancillary and laboratory) are listed below for reference.   Imaging Studies: DG Chest 2 View Result Date: 02/17/2023 CLINICAL DATA:  Short of breath, productive cough EXAM: CHEST - 2 VIEW COMPARISON:  02/06/2021 FINDINGS: Frontal and lateral views of the chest demonstrate an unremarkable cardiac silhouette. No acute airspace disease, effusion, or pneumothorax. No acute bony abnormalities. IMPRESSION: 1. No acute intrathoracic process. Electronically Signed   By: Sharlet Salina M.D.   On: 02/17/2023 23:11    Microbiology: Results for orders placed or performed during the hospital encounter of 02/17/23  Resp panel by RT-PCR (RSV, Flu A&B, Covid) Anterior Nasal Swab     Status: None   Collection Time: 02/17/23 10:47 PM   Specimen: Anterior Nasal Swab  Result Value Ref Range Status   SARS Coronavirus 2 by RT PCR NEGATIVE NEGATIVE Final    Comment: (NOTE) SARS-CoV-2 target nucleic acids are NOT DETECTED.  The SARS-CoV-2 RNA is generally detectable in upper respiratory specimens during the acute phase of infection. The lowest concentration of SARS-CoV-2 viral copies this assay can detect is 138 copies/mL. A negative result does not preclude  SARS-Cov-2 infection and should not be used as the sole basis for treatment or other patient management decisions. A negative result may occur with  improper specimen collection/handling, submission of specimen other than nasopharyngeal swab, presence of viral mutation(s) within the areas targeted by this assay, and inadequate number of viral copies(<138 copies/mL). A negative result must be combined with clinical observations, patient history, and epidemiological information. The expected result is Negative.  Fact Sheet for Patients:  BloggerCourse.com  Fact Sheet for Healthcare Providers:  SeriousBroker.it  This test is no t yet approved or cleared by the [place] FDA and  has been authorized for detection and/or diagnosis of SARS-CoV-2 by FDA under an Emergency Use Authorization (EUA). This EUA will remain  in effect (meaning this test can be used) for the duration of the COVID-19 declaration under Section 564(b)(1) of the Act, 21 U.S.C.section 360bbb-3(b)(1), unless the authorization is terminated  or revoked sooner.       Influenza A by PCR NEGATIVE NEGATIVE Final   Influenza B by PCR NEGATIVE NEGATIVE Final    Comment: (NOTE) The Xpert Xpress SARS-CoV-2/FLU/RSV plus assay is intended as an aid in the diagnosis of influenza from  Nasopharyngeal swab specimens and should not be used as a sole basis for treatment. Nasal washings and aspirates are unacceptable for Xpert Xpress SARS-CoV-2/FLU/RSV testing.  Fact Sheet for Patients: BloggerCourse.com  Fact Sheet for Healthcare Providers: SeriousBroker.it  This test is not yet approved or cleared by the [place] FDA and has been authorized for detection and/or diagnosis of SARS-CoV-2 by FDA under an Emergency Use Authorization (EUA). This EUA will remain in effect (meaning this test can be used) for the duration of  the COVID-19 declaration under Section 564(b)(1) of the Act, 21 U.S.C. section 360bbb-3(b)(1), unless the authorization is terminated or revoked.     Resp Syncytial Virus by PCR NEGATIVE NEGATIVE Final    Comment: (NOTE) Fact Sheet for Patients: BloggerCourse.com  Fact Sheet for Healthcare Providers: SeriousBroker.it  This test is not yet approved or cleared by the [place] FDA and has been authorized for detection and/or diagnosis of SARS-CoV-2 by FDA under an Emergency Use Authorization (EUA). This EUA will remain in effect (meaning this test can be used) for the duration of the COVID-19 declaration under Section 564(b)(1) of the Act, 21 U.S.C. section 360bbb-3(b)(1), unless the authorization is terminated or revoked.  Performed at [facility], [address], [place], [place] [address]     Labs: CBC: Recent Labs  Lab 02/17/23 2339 02/18/23 0508  WBC 10.4 12.3*  HGB 11.2* 11.3*  HCT 35.6* 35.0*  MCV 101.1* 100.0  PLT 327 328   Basic Metabolic Panel: Recent Labs  Lab 02/17/23 2339 02/18/23 0508 02/20/23 0416  NA 137 136 135  K 3.9 3.7 3.8  CL 104 102 98  CO2 27 24 27   GLUCOSE 200* 346* 253*  BUN 14 12 24*  CREATININE 0.76 0.63 0.76  CALCIUM 8.3* 8.4* 8.6*  MG  --  1.8  --   PHOS  --  3.3  --    Liver Function Tests: Recent Labs  Lab 02/18/23 0508  AST 23  ALT 30  ALKPHOS 71  BILITOT 0.5  PROT 6.7  ALBUMIN 3.1*   CBG: Recent Labs  Lab 02/19/23 1108 02/19/23 1652 02/19/23 2025 02/20/23 0720 02/20/23 1124  GLUCAP 187* 195* 235* 215* 146*    Discharge time spent: greater than 30 minutes.  Signed: Vassie Loll, MD [facility] 02/20/2023

## 2023-02-20 NOTE — Progress Notes (Signed)
Weaned pt from 2L to RA. Pt has tolerated well throughout the night.

## 2023-02-20 NOTE — Plan of Care (Signed)
  Problem: Education: Goal: Ability to describe self-care measures that may prevent or decrease complications (Diabetes Survival Skills Education) will improve Outcome: Progressing   Problem: Coping: Goal: Ability to adjust to condition or change in health will improve Outcome: Progressing   Problem: Health Behavior/Discharge Planning: Goal: Ability to manage health-related needs will improve Outcome: Progressing   Problem: Nutritional: Goal: Maintenance of adequate nutrition will improve Outcome: Progressing   Problem: Activity: Goal: Risk for activity intolerance will decrease Outcome: Progressing   Problem: Nutrition: Goal: Adequate nutrition will be maintained Outcome: Progressing   Problem: Coping: Goal: Level of anxiety will decrease Outcome: Progressing

## 2023-03-29 ENCOUNTER — Emergency Department (HOSPITAL_COMMUNITY)
Admission: EM | Admit: 2023-03-29 | Discharge: 2023-03-29 | Disposition: A | Discharge status: Home / Self Care | Attending: Emergency Medicine | Admitting: Emergency Medicine

## 2023-03-29 ENCOUNTER — Encounter (HOSPITAL_COMMUNITY): Payer: Self-pay | Admitting: Emergency Medicine

## 2023-03-29 ENCOUNTER — Emergency Department (HOSPITAL_COMMUNITY)

## 2023-03-29 ENCOUNTER — Other Ambulatory Visit: Payer: Self-pay

## 2023-03-29 DIAGNOSIS — E039 Hypothyroidism, unspecified: Secondary | ICD-10-CM | POA: Insufficient documentation

## 2023-03-29 DIAGNOSIS — Z79899 Other long term (current) drug therapy: Secondary | ICD-10-CM | POA: Insufficient documentation

## 2023-03-29 DIAGNOSIS — R569 Unspecified convulsions: Secondary | ICD-10-CM

## 2023-03-29 DIAGNOSIS — J449 Chronic obstructive pulmonary disease, unspecified: Secondary | ICD-10-CM | POA: Diagnosis not present

## 2023-03-29 DIAGNOSIS — I10 Essential (primary) hypertension: Secondary | ICD-10-CM | POA: Insufficient documentation

## 2023-03-29 DIAGNOSIS — R251 Tremor, unspecified: Secondary | ICD-10-CM | POA: Diagnosis not present

## 2023-03-29 DIAGNOSIS — R258 Other abnormal involuntary movements: Secondary | ICD-10-CM | POA: Insufficient documentation

## 2023-03-29 DIAGNOSIS — Z7951 Long term (current) use of inhaled steroids: Secondary | ICD-10-CM | POA: Diagnosis not present

## 2023-03-29 DIAGNOSIS — Z8543 Personal history of malignant neoplasm of ovary: Secondary | ICD-10-CM | POA: Insufficient documentation

## 2023-03-29 DIAGNOSIS — W19XXXA Unspecified fall, initial encounter: Secondary | ICD-10-CM

## 2023-03-29 LAB — COMPREHENSIVE METABOLIC PANEL
ALT: 15 U/L (ref 0–44)
AST: 17 U/L (ref 15–41)
Albumin: 3.8 g/dL (ref 3.5–5.0)
Alkaline Phosphatase: 70 U/L (ref 38–126)
Anion gap: 13 (ref 5–15)
BUN: 19 mg/dL (ref 6–20)
CO2: 26 mmol/L (ref 22–32)
Calcium: 9.3 mg/dL (ref 8.9–10.3)
Chloride: 95 mmol/L — ABNORMAL LOW (ref 98–111)
Creatinine, Ser: 0.69 mg/dL (ref 0.44–1.00)
GFR, Estimated: >60 mL/min (ref >60)
Glucose, Bld: 163 mg/dL — ABNORMAL HIGH (ref 70–99)
Potassium: 3.8 mmol/L (ref 3.5–5.1)
Sodium: 134 mmol/L — ABNORMAL LOW (ref 135–145)
Total Bilirubin: 0.3 mg/dL (ref 0.0–1.2)
Total Protein: 7.6 g/dL (ref 6.5–8.1)

## 2023-03-29 LAB — CBC WITH DIFFERENTIAL/PLATELET
Abs Immature Granulocytes: 0.04 10*3/uL (ref 0.00–0.07)
Basophils Absolute: 0.1 10*3/uL (ref 0.0–0.1)
Basophils Relative: 1 %
Eosinophils Absolute: 0.1 10*3/uL (ref 0.0–0.5)
Eosinophils Relative: 1 %
HCT: 46.2 % — ABNORMAL HIGH (ref 36.0–46.0)
Hemoglobin: 14.5 g/dL (ref 12.0–15.0)
Immature Granulocytes: 0 %
Lymphocytes Relative: 37 %
Lymphs Abs: 3.5 10*3/uL (ref 0.7–4.0)
MCH: 30.1 pg (ref 26.0–34.0)
MCHC: 31.4 g/dL (ref 30.0–36.0)
MCV: 95.9 fL (ref 80.0–100.0)
Monocytes Absolute: 0.6 10*3/uL (ref 0.1–1.0)
Monocytes Relative: 6 %
Neutro Abs: 5.2 10*3/uL (ref 1.7–7.7)
Neutrophils Relative %: 55 %
Platelets: 273 10*3/uL (ref 150–400)
RBC: 4.82 MIL/uL (ref 3.87–5.11)
RDW: 13.6 % (ref 11.5–15.5)
WBC: 9.5 10*3/uL (ref 4.0–10.5)
nRBC: 0 % (ref 0.0–0.2)

## 2023-03-29 LAB — URINALYSIS, ROUTINE W REFLEX MICROSCOPIC
Bacteria, UA: NONE SEEN
Bilirubin Urine: NEGATIVE
Glucose, UA: >=500 mg/dL — AB
Hgb urine dipstick: NEGATIVE
Ketones, ur: NEGATIVE mg/dL
Leukocytes,Ua: NEGATIVE
Nitrite: NEGATIVE
Protein, ur: NEGATIVE mg/dL
Specific Gravity, Urine: 1.013 (ref 1.005–1.030)
pH: 5 (ref 5.0–8.0)

## 2023-03-29 LAB — CBG MONITORING, ED: Glucose-Capillary: 155 mg/dL — ABNORMAL HIGH (ref 70–99)

## 2023-03-29 MED ORDER — FENTANYL CITRATE PF 50 MCG/ML IJ SOSY
50.0000 ug | PREFILLED_SYRINGE | Freq: Once | INTRAMUSCULAR | Status: AC
  Administered 2023-03-29: 50 ug via INTRAVENOUS
  Filled 2023-03-29: qty 1

## 2023-03-29 MED ORDER — ONDANSETRON HCL 4 MG/2ML IJ SOLN
4.0000 mg | Freq: Once | INTRAMUSCULAR | Status: AC
  Administered 2023-03-29: 4 mg via INTRAVENOUS
  Filled 2023-03-29: qty 2

## 2023-03-29 MED ORDER — SODIUM CHLORIDE 0.9 % IV BOLUS
1000.0000 mL | Freq: Once | INTRAVENOUS | Status: AC
  Administered 2023-03-29: 1000 mL via INTRAVENOUS

## 2023-03-29 NOTE — ED Triage Notes (Signed)
 Pt had fall at home and daughter states when she fell back her body began convulsing all over x 5 mins. When ems arrived pt was alert and oriented.

## 2023-03-29 NOTE — ED Provider Notes (Signed)
 [place] EMERGENCY DEPARTMENT AT [facility] Provider Note   CSN: [id number] Arrival date & time: 03/29/23  1738     History  Chief Complaint  Patient presents with   [name] is a 58 y.o. female.  Pt is a 58 yo female with pmhx significant for copd, htn, high cholesterol, depression, hypothyroidism, ovarian cancer, gerd, migraines, chronic pain, and anxiety.  Tonight, pt fell in the kitchen and when she fell, her daughter said she had seizure like activity for about 5 min.  No post ictal state.  Pt still feels shaky.  She denies any stroke like sx.       Home Medications Prior to Admission medications   Medication Sig Start Date End Date Taking? Authorizing Provider  acetaminophen (TYLENOL) 500 MG tablet Take 1,000-3,000 mg by mouth 3 (three) times daily as needed (back pain).    Yes [provider]  ADVAIR DISKUS 250-50 MCG/ACT AEPB Inhale 1 puff into the lungs 2 (two) times daily. 03/20/23  Yes [provider]  albuterol (VENTOLIN HFA) 108 (90 Base) MCG/ACT inhaler Inhale 2 puffs into the lungs every 6 (six) hours as needed for wheezing or shortness of breath. 02/20/23  Yes Vassie Loll, MD  atenolol-chlorthalidone (TENORETIC) 100-25 MG tablet Take 1 tablet by mouth daily. 03/18/23  Yes [provider]  DULoxetine (CYMBALTA) 30 MG capsule Take 30 mg by mouth daily.   Yes [provider]  escitalopram (LEXAPRO) 20 MG tablet Take 20 mg by mouth daily.   Yes [provider]  famotidine (PEPCID) 20 MG tablet Take 20 mg by mouth daily.   Yes [provider]  FARXIGA 10 MG TABS tablet Take 1 tablet (10 mg total) by mouth daily. 02/20/23  Yes Vassie Loll, MD  fish oil-omega-3 fatty acids 1000 MG capsule Take 1 g by mouth daily.   Yes [provider]  gabapentin (NEURONTIN) 300 MG capsule Take 1 capsule (300 mg total) by mouth 3 (three) times daily. 02/20/23  Yes Vassie Loll, MD  metFORMIN  (GLUCOPHAGE-XR) 500 MG 24 hr tablet Take 1 tablet (500 mg total) by mouth 2 (two) times daily. 02/20/23  Yes Vassie Loll, MD  nicotine (NICODERM CQ - DOSED IN MG/24 HOURS) 21 mg/24hr patch Place 1 patch (21 mg total) onto the skin daily. 02/20/23  Yes Vassie Loll, MD  olmesartan (BENICAR) 20 MG tablet Take 20 mg by mouth daily. 11/10/22  Yes [provider]  oxyCODONE (OXY IR/ROXICODONE) 5 MG immediate release tablet Take 1 tablet (5 mg total) by mouth every 8 (eight) hours as needed for severe pain (pain score 7-10). 02/20/23  Yes Vassie Loll, MD  rosuvastatin (CRESTOR) 10 MG tablet Take 1 tablet (10 mg total) by mouth daily. 02/20/23  Yes Vassie Loll, MD  atenolol (TENORMIN) 100 MG tablet Take 1 tablet (100 mg total) by mouth daily. 02/20/23 03/22/23  Vassie Loll, MD      Allergies    Cephalexin, Flexeril [cyclobenzaprine hcl], Morphine and codeine, Ultram [tramadol hcl], Nsaids, Sulfa antibiotics, Codeine, and Prednisone    Review of Systems   Review of Systems  Neurological:  Positive for tremors.  All other systems reviewed and are negative.   Physical Exam Updated Vital Signs BP 121/89   Pulse 89   Temp 98.2 F (36.8 C) (Oral)   Resp 18   Ht 4\' 2"  (1.27 m)   Wt 74 kg   SpO2 100%   BMI 45.88 kg/m  Physical Exam Vitals and nursing note reviewed.  Constitutional:      Appearance: Normal appearance. She is obese.  HENT:     Head: Normocephalic and atraumatic.     Right Ear: External ear normal.     Left Ear: External ear normal.     Nose: Nose normal.     Mouth/Throat:     Mouth: Mucous membranes are moist.     Pharynx: Oropharynx is clear.  Eyes:     Extraocular Movements: Extraocular movements intact.     Conjunctiva/sclera: Conjunctivae normal.     Pupils: Pupils are equal, round, and reactive to light.  Cardiovascular:     Rate and Rhythm: Normal rate and regular rhythm.     Pulses: Normal pulses.     Heart sounds: Normal heart sounds.   Pulmonary:     Effort: Pulmonary effort is normal.     Breath sounds: Normal breath sounds.  Abdominal:     General: Abdomen is flat. Bowel sounds are normal.     Palpations: Abdomen is soft.  Musculoskeletal:        General: Normal range of motion.     Cervical back: Normal range of motion and neck supple.  Skin:    General: Skin is warm.     Capillary Refill: Capillary refill takes less than 2 seconds.  Neurological:     General: No focal deficit present.     Mental Status: She is alert and oriented to person, place, and time.     Motor: Tremor present.  Psychiatric:        Mood and Affect: Mood normal.        Behavior: Behavior normal.     ED Results / Procedures / Treatments   Labs (all labs ordered are listed, but only abnormal results are displayed) Labs Reviewed  CBC WITH DIFFERENTIAL/PLATELET - Abnormal; Notable for the following components:      Result Value   HCT 46.2 (*)    All other components within normal limits  COMPREHENSIVE METABOLIC PANEL - Abnormal; Notable for the following components:   Sodium 134 (*)    Chloride 95 (*)    Glucose, Bld 163 (*)    All other components within normal limits  URINALYSIS, ROUTINE W REFLEX MICROSCOPIC - Abnormal; Notable for the following components:   Color, Urine STRAW (*)    Glucose, UA >=500 (*)    All other components within normal limits  CBG MONITORING, ED - Abnormal; Notable for the following components:   Glucose-Capillary 155 (*)    All other components within normal limits    EKG EKG Interpretation Date/Time:  Sunday March 29 2023 19:22:02 EDT Ventricular Rate:  82 PR Interval:  114 QRS Duration:  100 QT Interval:  446 QTC Calculation: 521 R Axis:   71  Text Interpretation: Sinus rhythm Borderline short PR interval RSR' in V1 or V2, right VCD or RVH Nonspecific T abnormalities, diffuse leads Prolonged QT interval No significant change since last tracing Confirmed by Jacalyn Lefevre [phone]) on 03/29/2023  11:46:23 PM  Radiology DG Chest Portable 1 View Result Date: 03/29/2023 CLINICAL DATA:  Vascular injury EXAM: PORTABLE CHEST 1 VIEW COMPARISON:  Chest x-ray 02/17/2023 FINDINGS: The heart and mediastinal contours are within normal limits. No focal consolidation. No pulmonary edema. No pleural effusion. No pneumothorax. No acute osseous abnormality. Degenerative changes of the shoulders. IMPRESSION: No active disease. Electronically Signed   By: Tish Frederickson M.D.   On: 03/29/2023 18:53   CT  Head Wo Contrast Result Date: 03/29/2023 CLINICAL DATA:  Mental status change, unknown cause EXAM: CT HEAD WITHOUT CONTRAST TECHNIQUE: Contiguous axial images were obtained from the base of the skull through the vertex without intravenous contrast. RADIATION DOSE REDUCTION: This exam was performed according to the departmental dose-optimization program which includes automated exposure control, adjustment of the mA and/or kV according to patient size and/or use of iterative reconstruction technique. COMPARISON:  CT head 10/14/2010 FINDINGS: Brain: No evidence of large-territorial acute infarction. No parenchymal hemorrhage. No mass lesion. No extra-axial collection. No mass effect or midline shift. No hydrocephalus. Basilar cisterns are patent. Vascular: No hyperdense vessel. Skull: No acute fracture or focal lesion. Sinuses/Orbits: Paranasal sinuses and mastoid air cells are clear. The orbits are unremarkable. Other: None. IMPRESSION: No acute intracranial abnormality. Electronically Signed   By: Tish Frederickson M.D.   On: 03/29/2023 18:52    Procedures Procedures    Medications Ordered in ED Medications  sodium chloride 0.9 % bolus 1,000 mL (0 mLs Intravenous Stopped 03/29/23 2140)  ondansetron (ZOFRAN) injection 4 mg (4 mg Intravenous Given 03/29/23 2011)  fentaNYL (SUBLIMAZE) injection 50 mcg (50 mcg Intravenous Given 03/29/23 2011)    ED Course/ Medical Decision Making/ A&P                                  Medical Decision Making Amount and/or Complexity of Data Reviewed Labs: ordered. Radiology: ordered.  Risk Prescription drug management.   This patient presents to the ED for concern of possible seizure, this involves an extensive number of treatment options, and is a complaint that carries with it a high risk of complications and morbidity.  The differential diagnosis includes seizure, electrolyte abn, cva, infection   Co morbidities that complicate the patient evaluation  copd, htn, high cholesterol, depression, hypothyroidism, ovarian cancer, gerd, migraines, chronic pain, and anxiety   Additional history obtained:  Additional history obtained from epic chart review External records from outside source obtained and reviewed including EMS report   Lab Tests:  I Ordered, and personally interpreted labs.  The pertinent results include:  cbc nl, cmp nl, ua neg   Imaging Studies ordered:  I ordered imaging studies including cxr, ct head  I independently visualized and interpreted imaging which showed  CXR: No active disease.  CT head: No acute intracranial abnormality.  I agree with the radiologist interpretation   Cardiac Monitoring:  The patient was maintained on a cardiac monitor.  I personally viewed and interpreted the cardiac monitored which showed an underlying rhythm of: nsr   Medicines ordered and prescription drug management:  I ordered medication including ivfs/meds  for sx  Reevaluation of the patient after these medicines showed that the patient improved I have reviewed the patients home medicines and have made adjustments as needed   Test Considered:  ct   Problem List / ED Course:  Seizure-like activity:  pt was not post-ictal when ems arrived.  Etiology unclear.  She is fine now and is able to ambulate without difficulty.  She is stable for d/c.  She is to return if worse.  F/u with neuro.   Reevaluation:  After the interventions noted  above, I reevaluated the patient and found that they have :improved   Social Determinants of Health:  Lives at home   Dispostion:  After consideration of the diagnostic results and the patients response to treatment, I feel that the patent would  benefit from discharge with outpatient f/u.          Final Clinical Impression(s) / ED Diagnoses Final diagnoses:  Fall, initial encounter  Seizure-like activity (HCC)    Rx / DC Orders ED Discharge Orders          Ordered    Ambulatory referral to Neurology       Comments: An appointment is requested in approximately: 1 week   03/29/23 2205              Jacalyn Lefevre, MD 03/29/23 2349

## 2023-03-29 NOTE — ED Notes (Signed)
 Pt able to ambulate with steady gait, no assistance, to restroom. EDP notified.

## 2023-06-18 ENCOUNTER — Ambulatory Visit: Admitting: Neurology

## 2023-07-09 ENCOUNTER — Emergency Department (HOSPITAL_COMMUNITY)

## 2023-07-09 ENCOUNTER — Ambulatory Visit
Admission: EM | Admit: 2023-07-09 | Discharge: 2023-07-09 | Disposition: A | Discharge status: Nursing Home (Includes ICF)

## 2023-07-09 ENCOUNTER — Encounter (HOSPITAL_COMMUNITY): Payer: Self-pay

## 2023-07-09 ENCOUNTER — Encounter: Payer: Self-pay | Admitting: Emergency Medicine

## 2023-07-09 ENCOUNTER — Other Ambulatory Visit: Payer: Self-pay

## 2023-07-09 ENCOUNTER — Emergency Department (HOSPITAL_COMMUNITY)
Admission: EM | Admit: 2023-07-09 | Discharge: 2023-07-09 | Disposition: A | Discharge status: Home / Self Care | Attending: Emergency Medicine | Admitting: Emergency Medicine

## 2023-07-09 DIAGNOSIS — R5383 Other fatigue: Secondary | ICD-10-CM | POA: Diagnosis not present

## 2023-07-09 DIAGNOSIS — W19XXXA Unspecified fall, initial encounter: Secondary | ICD-10-CM

## 2023-07-09 DIAGNOSIS — J449 Chronic obstructive pulmonary disease, unspecified: Secondary | ICD-10-CM | POA: Diagnosis not present

## 2023-07-09 DIAGNOSIS — R0902 Hypoxemia: Secondary | ICD-10-CM | POA: Diagnosis not present

## 2023-07-09 DIAGNOSIS — W010XXA Fall on same level from slipping, tripping and stumbling without subsequent striking against object, initial encounter: Secondary | ICD-10-CM | POA: Diagnosis not present

## 2023-07-09 DIAGNOSIS — R062 Wheezing: Secondary | ICD-10-CM | POA: Insufficient documentation

## 2023-07-09 DIAGNOSIS — I959 Hypotension, unspecified: Secondary | ICD-10-CM

## 2023-07-09 DIAGNOSIS — M545 Low back pain, unspecified: Secondary | ICD-10-CM | POA: Insufficient documentation

## 2023-07-09 DIAGNOSIS — R400 Somnolence: Secondary | ICD-10-CM | POA: Diagnosis not present

## 2023-07-09 DIAGNOSIS — M25521 Pain in right elbow: Secondary | ICD-10-CM | POA: Insufficient documentation

## 2023-07-09 DIAGNOSIS — R42 Dizziness and giddiness: Secondary | ICD-10-CM | POA: Diagnosis not present

## 2023-07-09 DIAGNOSIS — Y92481 Parking lot as the place of occurrence of the external cause: Secondary | ICD-10-CM | POA: Insufficient documentation

## 2023-07-09 LAB — RAPID URINE DRUG SCREEN, HOSP PERFORMED
Amphetamines: NOT DETECTED
Barbiturates: NOT DETECTED
Benzodiazepines: POSITIVE — AB
Cocaine: NOT DETECTED
Opiates: NOT DETECTED
Tetrahydrocannabinol: NOT DETECTED

## 2023-07-09 LAB — URINALYSIS, ROUTINE W REFLEX MICROSCOPIC
Bacteria, UA: NONE SEEN
Bilirubin Urine: NEGATIVE
Glucose, UA: >=500 mg/dL — AB
Hgb urine dipstick: NEGATIVE
Ketones, ur: NEGATIVE mg/dL
Leukocytes,Ua: NEGATIVE
Nitrite: NEGATIVE
Protein, ur: NEGATIVE mg/dL
Specific Gravity, Urine: 1.021 (ref 1.005–1.030)
pH: 5 (ref 5.0–8.0)

## 2023-07-09 LAB — BASIC METABOLIC PANEL WITH GFR
Anion gap: 8 (ref 5–15)
BUN: 16 mg/dL (ref 6–20)
CO2: 28 mmol/L (ref 22–32)
Calcium: 8.2 mg/dL — ABNORMAL LOW (ref 8.9–10.3)
Chloride: 101 mmol/L (ref 98–111)
Creatinine, Ser: 0.69 mg/dL (ref 0.44–1.00)
GFR, Estimated: >60 mL/min (ref >60)
Glucose, Bld: 150 mg/dL — ABNORMAL HIGH (ref 70–99)
Potassium: 3.9 mmol/L (ref 3.5–5.1)
Sodium: 137 mmol/L (ref 135–145)

## 2023-07-09 LAB — CBC WITH DIFFERENTIAL/PLATELET
Abs Immature Granulocytes: 0.02 10*3/uL (ref 0.00–0.07)
Basophils Absolute: 0.1 10*3/uL (ref 0.0–0.1)
Basophils Relative: 1 %
Eosinophils Absolute: 0.1 10*3/uL (ref 0.0–0.5)
Eosinophils Relative: 1 %
HCT: 42.1 % (ref 36.0–46.0)
Hemoglobin: 13 g/dL (ref 12.0–15.0)
Immature Granulocytes: 0 %
Lymphocytes Relative: 44 %
Lymphs Abs: 3.3 10*3/uL (ref 0.7–4.0)
MCH: 30.4 pg (ref 26.0–34.0)
MCHC: 30.9 g/dL (ref 30.0–36.0)
MCV: 98.6 fL (ref 80.0–100.0)
Monocytes Absolute: 0.6 10*3/uL (ref 0.1–1.0)
Monocytes Relative: 8 %
Neutro Abs: 3.4 10*3/uL (ref 1.7–7.7)
Neutrophils Relative %: 46 %
Platelets: 274 10*3/uL (ref 150–400)
RBC: 4.27 MIL/uL (ref 3.87–5.11)
RDW: 14.6 % (ref 11.5–15.5)
WBC: 7.4 10*3/uL (ref 4.0–10.5)
nRBC: 0 % (ref 0.0–0.2)

## 2023-07-09 LAB — BRAIN NATRIURETIC PEPTIDE: B Natriuretic Peptide: 65 pg/mL (ref 0.0–100.0)

## 2023-07-09 LAB — POCT FASTING CBG KUC MANUAL ENTRY: POCT Glucose (KUC): 192 mg/dL — AB (ref 70–99)

## 2023-07-09 LAB — ETHANOL: Alcohol, Ethyl (B): <15 mg/dL (ref <15)

## 2023-07-09 LAB — BLOOD GAS, VENOUS
Acid-Base Excess: 1.6 mmol/L (ref 0.0–2.0)
Bicarbonate: 30.3 mmol/L — ABNORMAL HIGH (ref 20.0–28.0)
Drawn by: 45381
O2 Saturation: 76.4 %
Patient temperature: 37
pCO2, Ven: 66 mmHg — ABNORMAL HIGH (ref 44–60)
pH, Ven: 7.27 (ref 7.25–7.43)
pO2, Ven: 47 mmHg — ABNORMAL HIGH (ref 32–45)

## 2023-07-09 LAB — CBG MONITORING, ED: Glucose-Capillary: 163 mg/dL — ABNORMAL HIGH (ref 70–99)

## 2023-07-09 MED ORDER — SODIUM CHLORIDE 0.9 % IV BOLUS: 1000.0000 mL | Freq: Once | INTRAVENOUS | Status: DC

## 2023-07-09 MED ORDER — OXYCODONE-ACETAMINOPHEN 5-325 MG PO TABS: 1.0000 | ORAL_TABLET | Freq: Four times a day (QID) | ORAL | 0 refills | Status: AC | PRN

## 2023-07-09 NOTE — ED Provider Notes (Signed)
 [place] EMERGENCY DEPARTMENT AT [facility] Provider Note   CSN: [id number] Arrival date & time: 07/09/23  8190     Patient presents with: [name] is a 58 y.o. female.  She is brought in from urgent care by ambulance after a fall.  She is very lethargic and unable to answer questions without falling asleep, level 5 caveat.  Reportedly fell at [employer].  Complaining of elbow pain and back pain, history of back pain.  She denies any narcotic pain medication.  History of COPD.  Not on a blood thinner.   The history is provided by the patient. The history is limited by the condition of the patient.  Altered Mental Status Presenting symptoms: lethargy   Most recent episode:  Today Timing:  Constant Progression:  Unchanged Chronicity:  New      Prior to Admission medications   Medication Sig Start Date End Date Taking? Authorizing Provider  acetaminophen  (TYLENOL ) 500 MG tablet Take 1,000-3,000 mg by mouth 3 (three) times daily as needed (back pain).     [provider]  ADVAIR DISKUS 250-50 MCG/ACT AEPB Inhale 1 puff into the lungs 2 (two) times daily. 03/20/23   [provider]  albuterol  (VENTOLIN  HFA) 108 (90 Base) MCG/ACT inhaler Inhale 2 puffs into the lungs every 6 (six) hours as needed for wheezing or shortness of breath. 02/20/23   Ricky Fines, MD  atenolol  (TENORMIN ) 100 MG tablet Take 1 tablet (100 mg total) by mouth daily. 02/20/23 03/22/23  Ricky Fines, MD  atenolol -chlorthalidone  (TENORETIC ) 100-25 MG tablet Take 1 tablet by mouth daily. 03/18/23   [provider]  DULoxetine  (CYMBALTA ) 30 MG capsule Take 30 mg by mouth daily.    [provider]  escitalopram  (LEXAPRO ) 20 MG tablet Take 20 mg by mouth daily.    [provider]  famotidine  (PEPCID ) 20 MG tablet Take 20 mg by mouth daily.    [provider]  FARXIGA  10 MG TABS tablet Take 1 tablet (10 mg total) by mouth daily. 02/20/23    Ricky Fines, MD  fish oil-omega-3 fatty acids 1000 MG capsule Take 1 g by mouth daily.    [provider]  gabapentin  (NEURONTIN ) 300 MG capsule Take 1 capsule (300 mg total) by mouth 3 (three) times daily. 02/20/23   Ricky Fines, MD  ketoconazole (NIZORAL) 2 % cream Apply topically 2 (two) times daily. 06/24/23   [provider]  LANTUS  SOLOSTAR 100 UNIT/ML Solostar Pen SMARTSIG:20 Unit(s) SUB-Q Every Evening 06/24/23   [provider]  metFORMIN  (GLUCOPHAGE -XR) 500 MG 24 hr tablet Take 1 tablet (500 mg total) by mouth 2 (two) times daily. 02/20/23   Ricky Fines, MD  nicotine  (NICODERM CQ  - DOSED IN MG/24 HOURS) 21 mg/24hr patch Place 1 patch (21 mg total) onto the skin daily. 02/20/23   Ricky Fines, MD  olmesartan (BENICAR) 20 MG tablet Take 20 mg by mouth daily. 11/10/22   [provider]  oxyCODONE  (OXY IR/ROXICODONE ) 5 MG immediate release tablet Take 1 tablet (5 mg total) by mouth every 8 (eight) hours as needed for severe pain (pain score 7-10). 02/20/23   Ricky Fines, MD  rosuvastatin  (CRESTOR ) 10 MG tablet Take 1 tablet (10 mg total) by mouth daily. 02/20/23   Ricky Fines, MD    Allergies: Cephalexin, Flexeril [cyclobenzaprine hcl], Morphine and codeine, Ultram [tramadol hcl], Codeine, Nsaids, Prednisone , and Sulfa antibiotics    Review of Systems  Updated Vital Signs BP  104/88   Pulse 70   Temp 98.6 F (37 C) (Oral)   Resp (!) 21   Wt 74 kg   SpO2 95%   BMI 45.88 kg/m   Physical Exam Vitals and nursing note reviewed.  Constitutional:      General: She is not in acute distress.    Appearance: Normal appearance. She is well-developed.  HENT:     Head: Normocephalic and atraumatic.  Eyes:     Conjunctiva/sclera: Conjunctivae normal.  Cardiovascular:     Rate and Rhythm: Normal rate and regular rhythm.     Heart sounds: No murmur heard. Pulmonary:     Effort: Pulmonary effort is normal. No respiratory distress.     Breath  sounds: Wheezing present.  Abdominal:     Palpations: Abdomen is soft.     Tenderness: There is no abdominal tenderness. There is no guarding or rebound.  Musculoskeletal:        General: No tenderness or deformity. Normal range of motion.     Cervical back: Neck supple.  Skin:    General: Skin is warm and dry.  Neurological:     General: No focal deficit present.     Mental Status: She is lethargic.     GCS: GCS eye subscore is 4. GCS verbal subscore is 5. GCS motor subscore is 6.     Comments: She is slow to answer questions after vigorous stimulation.  Slurred speech.  Moving all extremities nonfocally.     (all labs ordered are listed, but only abnormal results are displayed) Labs Reviewed  BASIC METABOLIC PANEL WITH GFR - Abnormal; Notable for the following components:      Result Value   Glucose, Bld 150 (*)    Calcium  8.2 (*)    All other components within normal limits  URINALYSIS, ROUTINE W REFLEX MICROSCOPIC - Abnormal; Notable for the following components:   Color, Urine STRAW (*)    Glucose, UA >=500 (*)    All other components within normal limits  RAPID URINE DRUG SCREEN, HOSP PERFORMED - Abnormal; Notable for the following components:   Benzodiazepines POSITIVE (*)    All other components within normal limits  BLOOD GAS, VENOUS - Abnormal; Notable for the following components:   pCO2, Ven 66 (*)    pO2, Ven 47 (*)    Bicarbonate 30.3 (*)    All other components within normal limits  CBG MONITORING, ED - Abnormal; Notable for the following components:   Glucose-Capillary 163 (*)    All other components within normal limits  CBC WITH DIFFERENTIAL/PLATELET  BRAIN NATRIURETIC PEPTIDE  ETHANOL    EKG: EKG Interpretation Date/Time:  Thursday July 09 2023 20:12:19 EDT Ventricular Rate:  73 PR Interval:  137 QRS Duration:  91 QT Interval:  521 QTC Calculation: 575 R Axis:   95  Text Interpretation: Sinus rhythm Borderline right axis deviation Borderline T  abnormalities, anterior leads Prolonged QT interval No significant change since prior 3/25 Confirmed by Towana Sharper [phone]) on 07/09/2023 8:31:40 PM  Radiology: [facility] Lumbar Spine Complete Result Date: 07/09/2023 CLINICAL DATA:  Fall today, lower back pain that is worse since falling EXAM: LUMBAR SPINE - COMPLETE 4+ VIEW COMPARISON:  08/27/2015 FINDINGS: Evidence of acute fracture or traumatic listhesis. Posterior fusion L5-S1 with interbody spacer. No radiographic evidence of loosening. Mild loss of disc space height at L3-L4. Intervertebral disc space height is otherwise maintained. Mild facet arthropathy at L2-L3 and L3-L4. IMPRESSION: No acute fracture or traumatic listhesis. Electronically  Signed   By: Norman Gatlin M.D.   On: 07/09/2023 22:12   DG Elbow Complete Right Result Date: 07/09/2023 CLINICAL DATA:  Fall-- BP was low Pt reports lower back pain that is chronic but feels worse since falling, and right elbow pain and abrasion is noted to the right elbow. EXAM: RIGHT ELBOW - COMPLETE 3+ VIEW COMPARISON:  None Available. FINDINGS: There is no evidence of fracture, dislocation, or joint effusion. There is no evidence of arthropathy or other focal bone abnormality. Soft tissues are unremarkable. IMPRESSION: No acute displaced fracture or dislocation. Electronically Signed   By: Morgane  Naveau M.D.   On: 07/09/2023 21:19   DG Chest 1 View Result Date: 07/09/2023 CLINICAL DATA:  Fall-- BP was low Pt reports lower back pain that is chronic but feels worse since falling, and right elbow pain and abrasion is noted to the right elbow. EXAM: CHEST  1 VIEW COMPARISON:  Cxr 03/29/23 FINDINGS: The heart and mediastinal contours are unchanged. Atherosclerotic plaque. No focal consolidation. No pulmonary edema. No pleural effusion. No pneumothorax. No acute osseous abnormality. IMPRESSION: 1. No active disease. 2.  Aortic Atherosclerosis (ICD10-I70.0). Electronically Signed   By: Morgane  Naveau M.D.   On:  07/09/2023 21:18   CT Head Wo Contrast Result Date: 07/09/2023 CLINICAL DATA:  Head trauma, abnormal mental status (Age 6-64y) Pt was trying to go into [employer] and tripped over her sandal and fell in the parking lot. Staff member from the UC saw her fall and brought her into the UC and told her BP was low and called EMS EXAM: CT HEAD WITHOUT CONTRAST TECHNIQUE: Contiguous axial images were obtained from the base of the skull through the vertex without intravenous contrast. RADIATION DOSE REDUCTION: This exam was performed according to the departmental dose-optimization program which includes automated exposure control, adjustment of the mA and/or kV according to patient size and/or use of iterative reconstruction technique. COMPARISON:  Ct head 03/29/23 FINDINGS: Brain: No evidence of large-territorial acute infarction. No parenchymal hemorrhage. No mass lesion. No extra-axial collection. No mass effect or midline shift. No hydrocephalus. Basilar cisterns are patent. Vascular: No hyperdense vessel. Skull: No acute fracture or focal lesion. Sinuses/Orbits: Paranasal sinuses and mastoid air cells are clear. The orbits are unremarkable. Other: None. IMPRESSION: No acute intracranial abnormality. Electronically Signed   By: Morgane  Naveau M.D.   On: 07/09/2023 21:16     Procedures   Medications Ordered in the ED - No data to display  Clinical Course as of 07/10/23 1042  Thu Jul 09, 2023  2130 Patient initially difficult to wake up so ordered lab work and head CT.  She is now awake and alert and wants to know why did not image her back also.  Will put her in for back imaging. [MB]  2244 X-rays of lumbar spine did not show any acute findings.  Reviewed with patient.  She is asking for a short course of pain medicine until she can see her doctor. [MB]    Clinical Course User Index [MB] Towana Ozell BROCKS, MD                                 Medical Decision Making Amount and/or Complexity of Data  Reviewed Labs: ordered. Radiology: ordered.  Risk Prescription drug management.   This patient complains of slip and fall, back pain, altered mental status; this involves an extensive number of treatment Options and  is a complaint that carries with it a high risk of complications and morbidity. The differential includes bleed, fracture, contusion, overmedication, respiratory failure  I ordered, reviewed and interpreted labs, which included CBC normal, chemistries with mildly elevated glucose, VBG with mild CO2 retention compensated, troponin and BNP normal, talk screen positive for benzos I ordered imaging studies which included CT head, x-rays of chest elbow and lumbar spine and I independently    visualized and interpreted imaging which showed no acute traumatic findings Additional history obtained from patient's family members Previous records obtained and reviewed in epic, recent ED and urgent care visits for COPD fall low blood pressure  Cardiac monitoring reviewed, sinus rhythm  Social determinants considered, tobacco use, transportation and utility insecurity Critical Interventions: None  After the interventions stated above, I reevaluated the patient and found patient to be awake alert Admission and further testing considered, no indications for admission or further workup at this time.  She is requesting a prescription for pain medication.  I recommended close follow-up with her PCP.  Return instructions discussed.      Final diagnoses:  Fall, initial encounter  Acute low back pain without sciatica, unspecified back pain laterality  Somnolence    ED Discharge Orders     None          Towana Ozell BROCKS, MD 07/10/23 1045

## 2023-07-09 NOTE — ED Notes (Signed)
 Pt en route to [facility] with EMS. Family aware.

## 2023-07-09 NOTE — ED Notes (Signed)
 Per pt and family, pt has hx of narcolepsy and falls asleep easily

## 2023-07-09 NOTE — ED Notes (Addendum)
 EMS at bedside. Pt noted to have increased confusion while speaking with EMS and family.

## 2023-07-09 NOTE — ED Triage Notes (Signed)
 Pt was trying to go into [employer] and tripped over her sandal and fell in the parking lot.  Staff member from the UC saw her fall and brought her into the UC and told her her BP was low and called EMS.  Pt reports lower back pain that is chronic but feels worse since falling, and right elbow pain and abrasion is noted to the right elbow.

## 2023-07-09 NOTE — ED Notes (Signed)
 Patient is being discharged from the Urgent Care and sent to the Emergency Department via EMS . Per PA, patient is in need of higher level of care due to hypotension/dizziness/weakness. Patient is aware and verbalizes understanding of plan of care.  Vitals:   07/09/23 1750 07/09/23 1757  BP: (!) 92/55   Pulse:    Resp:    Temp:    SpO2:  94%

## 2023-07-09 NOTE — ED Provider Notes (Signed)
 [facility]    CSN: [id number] Arrival date & time: 07/09/23  1648      History   Chief Complaint Chief Complaint  Patient presents with   Fall    HPI [name] is a 58 y.o. female.   Patient presenting today following a fall onto the concrete that occurred just prior to arrival in the alley way outside of this urgent care building on the far side.  Patient states that she was trying to get to the bathroom at [facility] next-door but was feeling dizzy and weak.  The fall was unwitnessed though her daughter was nearby.  Patient urinated on self either during the fall or upon getting up from the fall.  She notes that she has an abrasion to the left elbow/forearm region and tailbone and low back pain but of note does have chronic degenerative disc disease and chronic low back pain.  She states she has been feeling overall in her typical state of health throughout the day today, last meal was about an hour and a half ago per patient and daughter.  States she has consumed a typical amount of fluids for her today.  She denies chest pain, shortness of breath, confusion, speech changes, extremity weakness numbness or tingling, headache, head injury during fall, any recent medication changes or missed doses.  She states her blood sugars tend to be in the 3 or 400s but lately have been in the 100-200s since starting a new injection at nighttime.  She states she has not checked her blood sugar today.  Past medical history significant for COPD, migraines, hypertension, hyperlipidemia, degenerative disc disease, anxiety, diabetes on insulin .    Past Medical History:  Diagnosis Date   Anxiety    Chronic lower back pain    COPD (chronic obstructive pulmonary disease) (HCC)    DDD (degenerative disc disease)    Depression    Emphysema lung (HCC)    GERD (gastroesophageal reflux disease)    High cholesterol    History of hiatal hernia    Hypertension    Migraine    a few  times/month (04/25/2014)   Ovarian cancer (HCC)    Pneumonia    once or twice (04/25/2014)   Thyroid  disease    Type II diabetes mellitus [facility])     Patient Active Problem List   Diagnosis Date Noted   Acute exacerbation of chronic obstructive pulmonary disease (COPD) (HCC) 02/18/2023   Type 2 diabetes mellitus with hyperglycemia (HCC) 02/18/2023   Acquired hypothyroidism 02/18/2023   Dental cavities 02/09/2021    Class: Chronic   COPD exacerbation (HCC) 02/06/2021   Acute respiratory failure with hypoxia (HCC) 02/06/2021   Tobacco abuse 02/06/2021   COPD (chronic obstructive pulmonary disease) (HCC) 09/17/2020   Diabetes mellitus (HCC) 09/17/2020   Mixed hyperlipidemia 09/17/2020   Essential hypertension 09/17/2020   Acute cholecystitis 08/18/2020   Biliary obstruction 08/18/2020   Common bile duct dilatation 08/18/2020   Closed fracture of left distal radius and ulna 03/02/2018   Spondylolisthesis of lumbar region 04/25/2014    Past Surgical History:  Procedure Laterality Date   ABDOMINAL HYSTERECTOMY  ~ 1997   BACK SURGERY     CESAREAN SECTION  1988; 1995   DILATION AND CURETTAGE OF UTERUS     POSTERIOR LUMBAR FUSION  04/25/2014   L5-S1    OB History     Gravida  2   Para  2   Term  2   Preterm  AB      Living  2      SAB      IAB      Ectopic      Multiple      Live Births               Home Medications    Prior to Admission medications   Medication Sig Start Date End Date Taking? Authorizing Provider  ketoconazole (NIZORAL) 2 % cream Apply topically 2 (two) times daily. 06/24/23  Yes [provider]  LANTUS  SOLOSTAR 100 UNIT/ML Solostar Pen SMARTSIG:20 Unit(s) SUB-Q Every Evening 06/24/23  Yes [provider]  acetaminophen  (TYLENOL ) 500 MG tablet Take 1,000-3,000 mg by mouth 3 (three) times daily as needed (back pain).     [provider]  ADVAIR DISKUS 250-50 MCG/ACT AEPB Inhale 1 puff into the lungs 2  (two) times daily. 03/20/23   [provider]  albuterol  (VENTOLIN  HFA) 108 (90 Base) MCG/ACT inhaler Inhale 2 puffs into the lungs every 6 (six) hours as needed for wheezing or shortness of breath. 02/20/23   Ricky Fines, MD  atenolol  (TENORMIN ) 100 MG tablet Take 1 tablet (100 mg total) by mouth daily. 02/20/23 03/22/23  Ricky Fines, MD  atenolol -chlorthalidone  (TENORETIC ) 100-25 MG tablet Take 1 tablet by mouth daily. 03/18/23   [provider]  DULoxetine  (CYMBALTA ) 30 MG capsule Take 30 mg by mouth daily.    [provider]  escitalopram  (LEXAPRO ) 20 MG tablet Take 20 mg by mouth daily.    [provider]  famotidine  (PEPCID ) 20 MG tablet Take 20 mg by mouth daily.    [provider]  FARXIGA  10 MG TABS tablet Take 1 tablet (10 mg total) by mouth daily. 02/20/23   Ricky Fines, MD  fish oil-omega-3 fatty acids 1000 MG capsule Take 1 g by mouth daily.    [provider]  gabapentin  (NEURONTIN ) 300 MG capsule Take 1 capsule (300 mg total) by mouth 3 (three) times daily. 02/20/23   Ricky Fines, MD  metFORMIN  (GLUCOPHAGE -XR) 500 MG 24 hr tablet Take 1 tablet (500 mg total) by mouth 2 (two) times daily. 02/20/23   Ricky Fines, MD  nicotine  (NICODERM CQ  - DOSED IN MG/24 HOURS) 21 mg/24hr patch Place 1 patch (21 mg total) onto the skin daily. 02/20/23   Ricky Fines, MD  olmesartan (BENICAR) 20 MG tablet Take 20 mg by mouth daily. 11/10/22   [provider]  oxyCODONE  (OXY IR/ROXICODONE ) 5 MG immediate release tablet Take 1 tablet (5 mg total) by mouth every 8 (eight) hours as needed for severe pain (pain score 7-10). 02/20/23   Ricky Fines, MD  rosuvastatin  (CRESTOR ) 10 MG tablet Take 1 tablet (10 mg total) by mouth daily. 02/20/23   Ricky Fines, MD    Family History Family History  Problem Relation Age of Onset   Diabetes Brother    Hypertension Other    Diabetes Other    Thyroid  disease Other    Hyperlipidemia Other      Social History Social History   Tobacco Use   Smoking status: Every Day    Current packs/day: 0.50    Average packs/day: 0.5 packs/day for 32.0 years (16.0 ttl pk-yrs)    Types: Cigarettes   Smokeless tobacco: Never  Vaping Use   Vaping status: Never Used  Substance Use Topics   Alcohol use: Never   Drug use: No     Allergies   Cephalexin, Flexeril [cyclobenzaprine hcl], Morphine and  codeine, Ultram [tramadol hcl], Nsaids, Sulfa antibiotics, Codeine, and Prednisone    Review of Systems Review of Systems   Physical Exam Triage Vital Signs ED Triage Vitals [07/09/23 1732]  Encounter Vitals Group     BP (!) 87/62     Girls Systolic BP Percentile      Girls Diastolic BP Percentile      Boys Systolic BP Percentile      Boys Diastolic BP Percentile      Pulse Rate 79     Resp (!) 22     Temp 98.6 F (37 C)     Temp Source Oral     SpO2 91 %     Weight      Height      Head Circumference      Peak Flow      Pain Score 3     Pain Loc      Pain Education      Exclude from Growth Chart    No data found.  Updated Vital Signs BP (!) 92/55 (BP Location: Right Arm)   Pulse 79   Temp 98.6 F (37 C) (Oral)   Resp (!) 22   SpO2 94%   Visual Acuity Right Eye Distance:   Left Eye Distance:   Bilateral Distance:    Right Eye Near:   Left Eye Near:    Bilateral Near:     Physical Exam Vitals and nursing note reviewed.  HENT:     Head: Atraumatic.  Eyes:     Extraocular Movements: Extraocular movements intact.     Conjunctiva/sclera: Conjunctivae normal.  Cardiovascular:     Rate and Rhythm: Normal rate and regular rhythm.     Heart sounds: Normal heart sounds.  Pulmonary:     Comments: Increased labor of breathing, tachypnea, decreased breath sounds throughout, diffuse wheezes Musculoskeletal:     Cervical back: Normal range of motion and neck supple.     Comments: Currently in a wheelchair due to back pain and dizziness, fall just prior to arrival   Skin:    General: Skin is warm and dry.  Neurological:     Mental Status: She is alert.     Comments: Speech a bit slurred but unclear baseline, neither patient nor family expressing any mental status changes or concern regarding current speech pattern when asked.  Answering questions appropriately throughout provider portion of exam  Psychiatric:        Mood and Affect: Mood normal.        Thought Content: Thought content normal.        Judgment: Judgment normal.      UC Treatments / Results  Labs (all labs ordered are listed, but only abnormal results are displayed) Labs Reviewed  POCT FASTING CBG [facility] MANUAL ENTRY - Abnormal; Notable for the following components:      Result Value   POCT Glucose ([facility]) 192 (*)    All other components within normal limits    EKG   Radiology No results found.  Procedures Procedures (including critical care time)  Medications Ordered in UC Medications - No data to display  Initial Impression / Assessment and Plan / UC Course  I have reviewed the triage vital signs and the nursing notes.  Pertinent labs & imaging results that were available during my care of the patient were reviewed by me and considered in my medical decision making (see chart for details).     Random blood sugar 192, hypotensive and tachypneic  with borderline oxygen  saturations throughout time in exam room ranging from 89% to 91% on room air worse with speaking.  She does still endorse dizziness but she otherwise endorses feeling in her usual state of health.  Given her complex medical history, unstable vital signs and new dizziness recommended going to the emergency department and patient and family agreeable to EMS transport.  Peripheral IV and normal saline bolus as well as oxygen  via nasal cannula all placed prior to EMS arrival.  While EMS was questioning patient her speech appeared to become more slurred and she appeared to be a bit more disoriented and family members  now endorsing concern regarding speech change from her typical baseline.  EMS transported to emergency department for further evaluation.  Final Clinical Impressions(s) / UC Diagnoses   Final diagnoses:  Hypotension, unspecified hypotension type  Hypoxia  Dizziness  Fall, initial encounter   Discharge Instructions   None    ED Prescriptions   None    PDMP not reviewed this encounter.   Stuart Vernell Norris, [place] 07/09/23 1912

## 2023-07-09 NOTE — ED Notes (Addendum)
 Attempted to call report to [facility] ED charge x1, no answer. Triage RN x1, unable to take report at this time.

## 2023-07-09 NOTE — ED Triage Notes (Addendum)
 Pt family reports pt was walking this afternoon and tripped on concrete. Pt reports tailbone and right elbow pain. Pt states was trying to make it to bathroom at [facility]. Pt reports unable to hold it any longer and reports voided at time of fall.  Pt reports hx of chronic back pain but reports tailbone is more intense than usual.   Pt reports prior to fall, pt states became weak and dizziness.

## 2023-09-24 ENCOUNTER — Ambulatory Visit: Admitting: Neurology

## 2023-09-24 ENCOUNTER — Encounter: Payer: Self-pay | Admitting: Neurology

## 2023-12-28 ENCOUNTER — Ambulatory Visit: Admitting: Neurology

## 2023-12-28 ENCOUNTER — Telehealth: Payer: Self-pay | Admitting: Neurology

## 2023-12-28 NOTE — Telephone Encounter (Signed)
Patient called to verify appointment time °

## 2023-12-28 NOTE — Telephone Encounter (Signed)
 Pt has r/s due to a conflict

## 2024-04-14 ENCOUNTER — Ambulatory Visit: Admitting: Neurology
